# Patient Record
Sex: Female | Born: 1960 | Race: White | Hispanic: No | Marital: Single | Smoking: Former smoker
Health system: Southern US, Community
[De-identification: ages and names within clinical notes are randomized; demographics above are authoritative.]

## PROBLEM LIST (undated history)

## (undated) DIAGNOSIS — G4730 Sleep apnea, unspecified: Secondary | ICD-10-CM

## (undated) DIAGNOSIS — Z800 Family history of malignant neoplasm of digestive organs: Secondary | ICD-10-CM

## (undated) DIAGNOSIS — Z8041 Family history of malignant neoplasm of ovary: Secondary | ICD-10-CM

## (undated) DIAGNOSIS — M26629 Arthralgia of temporomandibular joint, unspecified side: Secondary | ICD-10-CM

## (undated) DIAGNOSIS — S80211A Abrasion, right knee, initial encounter: Secondary | ICD-10-CM

## (undated) DIAGNOSIS — F419 Anxiety disorder, unspecified: Secondary | ICD-10-CM

## (undated) DIAGNOSIS — R079 Chest pain, unspecified: Secondary | ICD-10-CM

## (undated) DIAGNOSIS — Z8051 Family history of malignant neoplasm of kidney: Secondary | ICD-10-CM

## (undated) DIAGNOSIS — C50912 Malignant neoplasm of unspecified site of left female breast: Secondary | ICD-10-CM

## (undated) DIAGNOSIS — R51 Headache: Secondary | ICD-10-CM

## (undated) DIAGNOSIS — M1990 Unspecified osteoarthritis, unspecified site: Secondary | ICD-10-CM

## (undated) DIAGNOSIS — Z803 Family history of malignant neoplasm of breast: Secondary | ICD-10-CM

## (undated) DIAGNOSIS — E039 Hypothyroidism, unspecified: Secondary | ICD-10-CM

## (undated) DIAGNOSIS — I10 Essential (primary) hypertension: Secondary | ICD-10-CM

## (undated) DIAGNOSIS — T4145XA Adverse effect of unspecified anesthetic, initial encounter: Secondary | ICD-10-CM

## (undated) DIAGNOSIS — K219 Gastro-esophageal reflux disease without esophagitis: Secondary | ICD-10-CM

## (undated) HISTORY — DX: Chest pain, unspecified: R07.9

## (undated) HISTORY — PX: UVULOPALATOPHARYNGOPLASTY: SHX827

## (undated) HISTORY — PX: FOOT SURGERY: SHX648

## (undated) HISTORY — DX: Family history of malignant neoplasm of ovary: Z80.41

## (undated) HISTORY — DX: Family history of malignant neoplasm of digestive organs: Z80.0

## (undated) HISTORY — DX: Family history of malignant neoplasm of breast: Z80.3

## (undated) HISTORY — PX: FOREARM SURGERY: SHX651

## (undated) HISTORY — DX: Essential (primary) hypertension: I10

## (undated) HISTORY — DX: Malignant neoplasm of unspecified site of left female breast: C50.912

## (undated) HISTORY — DX: Family history of malignant neoplasm of kidney: Z80.51

---

## 1998-06-10 ENCOUNTER — Ambulatory Visit (HOSPITAL_BASED_OUTPATIENT_CLINIC_OR_DEPARTMENT_OTHER): Admission: RE | Admit: 1998-06-10 | Discharge: 1998-06-10 | Payer: Self-pay | Admitting: Orthopedic Surgery

## 1998-12-31 ENCOUNTER — Ambulatory Visit: Admission: RE | Admit: 1998-12-31 | Discharge: 1998-12-31 | Payer: Self-pay | Admitting: *Deleted

## 1999-03-24 ENCOUNTER — Other Ambulatory Visit: Admission: RE | Admit: 1999-03-24 | Discharge: 1999-03-24 | Payer: Self-pay | Admitting: *Deleted

## 1999-03-24 ENCOUNTER — Encounter (INDEPENDENT_AMBULATORY_CARE_PROVIDER_SITE_OTHER): Payer: Self-pay | Admitting: Specialist

## 1999-07-07 ENCOUNTER — Ambulatory Visit (HOSPITAL_COMMUNITY): Admission: RE | Admit: 1999-07-07 | Discharge: 1999-07-08 | Payer: Self-pay | Admitting: *Deleted

## 1999-11-12 ENCOUNTER — Ambulatory Visit: Admission: RE | Admit: 1999-11-12 | Discharge: 1999-11-12 | Payer: Self-pay | Admitting: *Deleted

## 1999-12-14 ENCOUNTER — Other Ambulatory Visit: Admission: RE | Admit: 1999-12-14 | Discharge: 1999-12-14 | Payer: Self-pay | Admitting: Obstetrics and Gynecology

## 2000-12-20 ENCOUNTER — Other Ambulatory Visit: Admission: RE | Admit: 2000-12-20 | Discharge: 2000-12-20 | Payer: Self-pay | Admitting: Obstetrics and Gynecology

## 2001-11-14 ENCOUNTER — Emergency Department (HOSPITAL_COMMUNITY): Admission: EM | Admit: 2001-11-14 | Discharge: 2001-11-15 | Payer: Self-pay

## 2001-12-11 ENCOUNTER — Other Ambulatory Visit: Admission: RE | Admit: 2001-12-11 | Discharge: 2001-12-11 | Payer: Self-pay | Admitting: Obstetrics and Gynecology

## 2002-11-12 ENCOUNTER — Ambulatory Visit (HOSPITAL_COMMUNITY): Admission: RE | Admit: 2002-11-12 | Discharge: 2002-11-12 | Payer: Self-pay | Admitting: Gastroenterology

## 2003-01-20 ENCOUNTER — Other Ambulatory Visit: Admission: RE | Admit: 2003-01-20 | Discharge: 2003-01-20 | Payer: Self-pay | Admitting: Obstetrics and Gynecology

## 2003-12-10 ENCOUNTER — Ambulatory Visit (HOSPITAL_BASED_OUTPATIENT_CLINIC_OR_DEPARTMENT_OTHER): Admission: RE | Admit: 2003-12-10 | Discharge: 2003-12-10 | Payer: Self-pay | Admitting: Orthopedic Surgery

## 2003-12-10 HISTORY — PX: ELBOW ARTHROSCOPY: SUR87

## 2004-01-13 ENCOUNTER — Encounter: Admission: RE | Admit: 2004-01-13 | Discharge: 2004-01-13 | Payer: Self-pay | Admitting: Family Medicine

## 2004-01-22 ENCOUNTER — Ambulatory Visit (HOSPITAL_COMMUNITY): Admission: RE | Admit: 2004-01-22 | Discharge: 2004-01-22 | Payer: Self-pay | Admitting: Family Medicine

## 2004-02-02 ENCOUNTER — Ambulatory Visit (HOSPITAL_COMMUNITY): Admission: RE | Admit: 2004-02-02 | Discharge: 2004-02-02 | Payer: Self-pay | Admitting: Family Medicine

## 2004-02-04 ENCOUNTER — Ambulatory Visit (HOSPITAL_COMMUNITY): Admission: RE | Admit: 2004-02-04 | Discharge: 2004-02-04 | Payer: Self-pay | Admitting: Family Medicine

## 2004-02-04 ENCOUNTER — Encounter (INDEPENDENT_AMBULATORY_CARE_PROVIDER_SITE_OTHER): Payer: Self-pay | Admitting: *Deleted

## 2004-04-14 ENCOUNTER — Observation Stay (HOSPITAL_COMMUNITY): Admission: RE | Admit: 2004-04-14 | Discharge: 2004-04-15 | Payer: Self-pay | Admitting: Surgery

## 2004-04-14 ENCOUNTER — Encounter (INDEPENDENT_AMBULATORY_CARE_PROVIDER_SITE_OTHER): Payer: Self-pay | Admitting: Specialist

## 2004-04-14 HISTORY — PX: THYROID LOBECTOMY: SHX420

## 2004-07-16 ENCOUNTER — Ambulatory Visit (HOSPITAL_COMMUNITY): Admission: RE | Admit: 2004-07-16 | Discharge: 2004-07-16 | Payer: Self-pay | Admitting: Obstetrics and Gynecology

## 2004-07-16 ENCOUNTER — Encounter (INDEPENDENT_AMBULATORY_CARE_PROVIDER_SITE_OTHER): Payer: Self-pay | Admitting: *Deleted

## 2004-07-16 ENCOUNTER — Ambulatory Visit (HOSPITAL_BASED_OUTPATIENT_CLINIC_OR_DEPARTMENT_OTHER): Admission: RE | Admit: 2004-07-16 | Discharge: 2004-07-16 | Payer: Self-pay | Admitting: Obstetrics and Gynecology

## 2004-07-16 HISTORY — PX: DILATION AND CURETTAGE OF UTERUS: SHX78

## 2004-10-06 ENCOUNTER — Encounter: Admission: RE | Admit: 2004-10-06 | Discharge: 2004-10-06 | Payer: Self-pay | Admitting: Family Medicine

## 2005-04-12 ENCOUNTER — Encounter: Admission: RE | Admit: 2005-04-12 | Discharge: 2005-04-12 | Payer: Self-pay | Admitting: Family Medicine

## 2008-04-12 ENCOUNTER — Emergency Department (HOSPITAL_COMMUNITY): Admission: EM | Admit: 2008-04-12 | Discharge: 2008-04-12 | Payer: Self-pay | Admitting: Emergency Medicine

## 2010-10-23 ENCOUNTER — Encounter: Payer: Self-pay | Admitting: Family Medicine

## 2010-10-24 ENCOUNTER — Encounter: Payer: Self-pay | Admitting: Family Medicine

## 2010-11-25 ENCOUNTER — Other Ambulatory Visit: Payer: Self-pay | Admitting: Radiology

## 2010-11-26 ENCOUNTER — Other Ambulatory Visit: Payer: Self-pay | Admitting: Oncology

## 2010-11-26 ENCOUNTER — Other Ambulatory Visit: Payer: Self-pay | Admitting: Radiology

## 2010-11-26 ENCOUNTER — Encounter (HOSPITAL_BASED_OUTPATIENT_CLINIC_OR_DEPARTMENT_OTHER): Payer: 59 | Admitting: Oncology

## 2010-11-26 DIAGNOSIS — C50912 Malignant neoplasm of unspecified site of left female breast: Secondary | ICD-10-CM

## 2010-11-26 DIAGNOSIS — C50919 Malignant neoplasm of unspecified site of unspecified female breast: Secondary | ICD-10-CM

## 2010-11-26 LAB — COMPREHENSIVE METABOLIC PANEL
ALT: 25 U/L (ref 0–35)
BUN: 17 mg/dL (ref 6–23)
CO2: 24 mEq/L (ref 19–32)
Chloride: 104 mEq/L (ref 96–112)
Creatinine, Ser: 0.63 mg/dL (ref 0.40–1.20)
Glucose, Bld: 87 mg/dL (ref 70–99)
Total Bilirubin: 0.7 mg/dL (ref 0.3–1.2)

## 2010-11-26 LAB — CBC WITH DIFFERENTIAL/PLATELET
BASO%: 0 % (ref 0.0–2.0)
EOS%: 4.2 % (ref 0.0–7.0)
HGB: 14.9 g/dL (ref 11.6–15.9)
LYMPH%: 40.5 % (ref 14.0–49.7)
MCH: 29.8 pg (ref 25.1–34.0)
MCHC: 34.6 g/dL (ref 31.5–36.0)
MCV: 86.1 fL (ref 79.5–101.0)
NEUT#: 2.5 10*3/uL (ref 1.5–6.5)
NEUT%: 44.7 % (ref 38.4–76.8)
Platelets: 223 10*3/uL (ref 145–400)
RBC: 4.99 10*6/uL (ref 3.70–5.45)
RDW: 12.4 % (ref 11.2–14.5)

## 2010-11-28 ENCOUNTER — Ambulatory Visit
Admission: RE | Admit: 2010-11-28 | Discharge: 2010-11-28 | Disposition: A | Discharge status: Home / Self Care | Payer: 59 | Source: Ambulatory Visit | Attending: Radiology | Admitting: Radiology

## 2010-11-28 DIAGNOSIS — C50912 Malignant neoplasm of unspecified site of left female breast: Secondary | ICD-10-CM

## 2010-11-28 MED ORDER — GADOBENATE DIMEGLUMINE 529 MG/ML IV SOLN
20.0000 mL | Freq: Once | INTRAVENOUS | Status: AC | PRN
  Administered 2010-11-28: 20 mL via INTRAVENOUS

## 2010-12-01 ENCOUNTER — Encounter: Payer: 59 | Admitting: Oncology

## 2010-12-03 ENCOUNTER — Encounter: Payer: 59 | Admitting: Genetic Counselor

## 2010-12-03 ENCOUNTER — Other Ambulatory Visit (HOSPITAL_COMMUNITY): Payer: Self-pay | Admitting: Surgery

## 2010-12-03 DIAGNOSIS — C50919 Malignant neoplasm of unspecified site of unspecified female breast: Secondary | ICD-10-CM

## 2010-12-28 ENCOUNTER — Ambulatory Visit (HOSPITAL_COMMUNITY)
Admission: RE | Admit: 2010-12-28 | Discharge: 2010-12-28 | Disposition: A | Discharge status: Home / Self Care | Payer: 59 | Source: Ambulatory Visit | Attending: Surgery | Admitting: Surgery

## 2010-12-28 ENCOUNTER — Ambulatory Visit (HOSPITAL_COMMUNITY): Payer: 59

## 2010-12-28 ENCOUNTER — Other Ambulatory Visit: Payer: Self-pay | Admitting: Surgery

## 2010-12-28 ENCOUNTER — Observation Stay (HOSPITAL_COMMUNITY)
Admission: RE | Admit: 2010-12-28 | Discharge: 2010-12-29 | DRG: 581 | Disposition: A | Discharge status: Home / Self Care | Payer: 59 | Source: Ambulatory Visit | Attending: Surgery | Admitting: Surgery

## 2010-12-28 DIAGNOSIS — C50219 Malignant neoplasm of upper-inner quadrant of unspecified female breast: Principal | ICD-10-CM | POA: Insufficient documentation

## 2010-12-28 DIAGNOSIS — T8859XA Other complications of anesthesia, initial encounter: Secondary | ICD-10-CM

## 2010-12-28 DIAGNOSIS — C50919 Malignant neoplasm of unspecified site of unspecified female breast: Secondary | ICD-10-CM

## 2010-12-28 DIAGNOSIS — I10 Essential (primary) hypertension: Secondary | ICD-10-CM | POA: Insufficient documentation

## 2010-12-28 DIAGNOSIS — E7800 Pure hypercholesterolemia, unspecified: Secondary | ICD-10-CM | POA: Insufficient documentation

## 2010-12-28 HISTORY — PX: MASTECTOMY: SHX3

## 2010-12-28 HISTORY — DX: Other complications of anesthesia, initial encounter: T88.59XA

## 2010-12-28 LAB — COMPREHENSIVE METABOLIC PANEL
AST: 26 U/L (ref 0–37)
BUN: 8 mg/dL (ref 6–23)
CO2: 25 mEq/L (ref 19–32)
Chloride: 105 mEq/L (ref 96–112)
GFR calc Af Amer: >60 mL/min (ref >60)
GFR calc non Af Amer: >60 mL/min (ref >60)
Glucose, Bld: 102 mg/dL — ABNORMAL HIGH (ref 70–99)
Sodium: 137 mEq/L (ref 135–145)
Total Bilirubin: 0.5 mg/dL (ref 0.3–1.2)

## 2010-12-28 LAB — CBC
Hemoglobin: 14.7 g/dL (ref 12.0–15.0)
MCHC: 34.8 g/dL (ref 30.0–36.0)

## 2010-12-28 LAB — DIFFERENTIAL
Basophils Relative: 0 % (ref 0–1)
Eosinophils Absolute: 0.2 10*3/uL (ref 0.0–0.7)
Monocytes Absolute: 0.6 10*3/uL (ref 0.1–1.0)
Neutrophils Relative %: 57 % (ref 43–77)

## 2010-12-28 MED ORDER — TECHNETIUM TC 99M SULFUR COLLOID FILTERED
1.0000 | Freq: Once | INTRAVENOUS | Status: AC | PRN
  Administered 2010-12-28: 1 via INTRADERMAL

## 2011-01-17 ENCOUNTER — Encounter (HOSPITAL_BASED_OUTPATIENT_CLINIC_OR_DEPARTMENT_OTHER): Payer: 59 | Admitting: Oncology

## 2011-01-17 DIAGNOSIS — C50919 Malignant neoplasm of unspecified site of unspecified female breast: Secondary | ICD-10-CM

## 2011-01-17 NOTE — Discharge Summary (Signed)
Deanna Barry, SIPLE               ACCOUNT NO.:  [id number]  MEDICAL RECORD NO.:  [id number]           PATIENT TYPE:  I  LOCATION:  5152                         FACILITY:  [facility]  PHYSICIAN:  Sandria Bales. Ezzard Standing, M.D.  DATE OF BIRTH:  July 01, 1961  DATE OF ADMISSION:  12/28/2010 DATE OF DISCHARGE:  12/29/2010                              DISCHARGE SUMMARY  DISCHARGE DIAGNOSES: 1. 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 2. Migraine headaches. 3. Hypertension. 4. History of thyroid surgery for benign disease. 5. Arthritis. 6. Hypercholesterolemia.  OPERATION PERFORMED:  Bilateral simple mastectomies and left axillary sentinel lymph node biopsy on December 28, 2010.  HISTORY OF ILLNESS:  Deanna Barry is a 50 year old white female who sees Dr. Carolin Coy as her primary medical doctor and Arlana Lindau, nurse practitioner, with Dr. Floyde Parkins from a GYN standpoint.  She had mammograms which shows a new abnormality in upper inner aspect of her left breast.  She underwent a biopsy of that area that showed an invasive ductal carcinoma.  The path number was [id number], and was read by Dr. Hollice Espy.  This tumor was ER and PR receptor positive, HER-2/neu negative and Ki67 was 20%.  Significantly, she had a mother who had breast cancer diagnosed in 2006. She has a grandmother on her father's side who had breast cancer.  [facility] and I saw in conjunction with Dr. Ruthann Cancer and Dr. Antony Blackbird.  We had a lengthy discussion with her that she would be a candidate for breast conservation surgery which would include lumpectomy and sentinel lymph node biopsy but the patient was concerned because of her family history, and she would prefer a mastectomy on the side with breast cancer and prophylactic mastectomy on the other side.  I tried to answer questions regarding the indications and  potential complications of surgery.  We also discussed breast reconstruction which she was not interested in at this time.  She had a friend, Wilfred Lacy, I think assisted her in some of these decision making.  PAST MEDICAL HISTORY: 1. She does have history of migraine headaches, controlled on her     current medications. 2. She has hypertension. 3. Has a history of thyroid surgery, benign disease. 4. Has a history of arthritis. 5. Has hypercholesterolemia.  So on the day of admission, she was taken to the operating room where she underwent bilateral simple mastectomies.  She underwent a biopsy of a left axillary sentinel lymph node which on touch prep was negative.  Postoperatively, she did well.  She has some soreness at her incisions, but drainage on both sides has been fairly minimal.  She has two drains on the left side and one drain on the right side.  Her pathology is pending at the time of this dictation, but she is ready for discharge.  Her discharge instructions will include no driving for 2-3 days.  She can eat, has no restrictions on her diet.  She can remove her bandages and shower starting tomorrow, December 30, 2010.  She  is to record her Jackson-Pratt output a couple of times a day.  I am out of the office next week, so she has been arranged to see one of my partners next week in the office.  I am giving her Vicodin for pain.  She can resume her home medications which include: 1. Mobic 15 mg daily. 2. Omeprazole, over the counter. 3. Simvastatin 20 mg daily. 4. Levothyroxine 25 mcg daily. 5. Atenolol 25 mg daily. 6. She has some medicines for migraine, Imitrex and Effexor she can     use. 7. She is given Vicodin for pain.  Her discharge condition is good.   Her final pathology is pending at the time of this dictation.   Sandria Bales. Ezzard Standing, M.D., FACS   DHN/MEDQ  D:  12/29/2010  T:  12/29/2010  Job:  [id number]  cc:   Valentino Hue. Magrinat, M.D. Carola J.  Gerri Spore, M.D. Arlana Lindau, NP Billie Lade, Ph.D., M.D.  Electronically Signed by Ovidio Kin M.D. on 01/16/2011 07:42:18 PM

## 2011-01-18 NOTE — Op Note (Signed)
Deanna Barry, Deanna Barry               ACCOUNT NO.:  [id number]  MEDICAL RECORD NO.:  [id number]           PATIENT TYPE:  O  LOCATION:  SDSC                         FACILITY:  [facility]  PHYSICIAN:  Sandria Bales. Ezzard Standing, M.D.  DATE OF BIRTH:  September 04, 1961  DATE OF PROCEDURE: 28 December 2010                              OPERATIVE REPORT   PREOPERATIVE DIAGNOSIS:  Left breast cancer approximately 1.7 cm in upper inner quadrant.  POSTOPERATIVE DIAGNOSIS:  Left breast cancer approximately 1.7 cm upper inner quadrant with negative sentinel lymph node that is (T1N0).  PROCEDURE:  Bilateral simple mastectomies, injection of 2 mL of 40% methylene blue and the left breast prepped, left axillary sentinel lymph node biopsy.  SURGEON:  Sandria Bales. Ezzard Standing, MD  FIRST ASSISTANT:  None.  ANESTHESIA:  General endotracheal.  ESTIMATED BLOOD LOSS:  150 mL.  DRAINS:  Three drains, 2 on the left 19-French Blakes and 1 on the right.  INDICATION FOR PROCEDURE:  Deanna Barry is a 50 year old white female who sees Carolin Coy as her primary medical doctor, had an abnormal mammogram with followup which showed changes in the upper inner aspect of her left breast.  A biopsy revealed an invasive ductal carcinoma which was 8 mm on ultrasound but 1.7 cm on MRI.  The tumor is ER/PR receptor positive,  HER-2 negative, Ki67 was 20%.  She has a mother who had breast cancer.  She has a grandmother on her father's side who has breast cancer and though she was a good candidate for breast conservation with a possible lumpectomy, she was interested in bilateral mastectomies.  She presented to the [facility] at [facility] where she met Dr. Ruthann Cancer, Antony Blackbird, and myself and had a full discussion of the options of treatment again of lumpectomy versus mastectomy.  She really had sentinel node mastectomies before.  I saw her.  We talked about possibly of a reconstruction but at this  time she is not interested but realized at a later date this could be done.  I discussed with her the potential complications of surgery.  Potential complications including but not limited to bleeding, infection, nerve injury, recurrence of the tumor.  We also talked about doing a left axillary sentinel lymph node biopsy and if this was positive I will do a flexor dissection and if this was negative, I will stop at that  point.  OPERATIVE NOTE:  The patient brought to the operating room.  She had been injected in the holding area at the left breast with 1 mCi of technetium sulfa colloid.  She was put to sleep in room 17.  I then injected her left breast with 2 mL of 60% of methylene blue.  Her breasts were prepped with ChloraPrep and sterilely draped.    A time-out was held and a surgical checklist run.  I had marked her breast ahead of time both her inframammary folds and her mastectomy incision.  I started on the left side since that is where the sentinel lymph node was.  I went on and made an elliptical incision including her areola and excised  her breast cranial to about two fingerbreadths below the clavicle medially to the edge of the sternum inferiorly to the investing fascia, the rectus abdominis muscle laterally to latissimus dorsi muscle.  I then was able to find in the left axilla through the mastectomy incision a sentinel lymph node.  The sentinel lymph node had counts of 33,00 with background about 50, it was blue consistent with the primary sentinel lymph node.  This was sent for touch prep.  Dr. [place] Rund called back.  He said actually there are 2 lymph nodes identified in touch prep and both of these were negative.  Actually doing the resection, I found a third lymph node which was in the same general area so I took that and labeled that as an addendum or additional left axillary lymph node.  I then elevated the breast off the chest wall off the pectoralis  major and minor muscles.  I palpated the reddened axilla and felt nothing else which felt abnormal.  I also tried to palpate a little bit between the pec major and pec minor again, palpated nothing that was abnormal. There was no obvious gross tumor that I transected and again the tumor is in the upper aspect of the left breast.  I then turned my attention to the right side.  I changed all instruments including knife, Bovie and gloves, gown.  I then made elliptical incision on the right side again including the areola.  I tried to preoperatively mark both sides as symmetrically as I could.  I excised the left breast since again there was no cancer on the right side I did not plan any sentinel lymph node biopsies.  I went cranial to the two fingers below the clavicle laterally to the latissimus dorsi muscle inferiorly to the investing fascia the rectus abdominis muscle medially to the edge of the sternum.  I then elevated the breast off the chest wall and sent this to pathology both breast specimens.  I put a long suture lateral to orient the specimens.  On both sides, there were some redundant skin so I could excise an ellipse on the cranial margin on both sides of superior margin.  I put a long suture caudad on the skin and a short suture lateral.  I excised about 4 cm of excess skin on the left side,, about 2 cm on the right side.  Because of the axillary lymph node biopsy which was little bit more of an axillary dissection, I did put two drains on the left side, these are 19-French Blake drains brought out through stab wounds inferiorly and sewed in place with 2-0 nylon suture on the right side.  I put a single 19-French Blake drain.  I then closed the skin on both sides with subcutaneous 3-0 Vicryl sutures, 5-0 and 4-0 Monocryl suture on both sides.  Both wounds I irrigated with 3-4 L of saline prior to closure.  I then painted both wounds with tincture of Benzoin and  steri-stripped and then sterilely dressed with 4x4s, Hypafix and the pressure dressing.  The patient tolerated the procedure well.  Final pathology is pending at the time of this dictation.   Sandria Bales. Ezzard Standing, M.D., FACS  DHN/MEDQ  D:  12/28/2010  T:  12/29/2010  Job:  [id number]  cc:   Arlana Lindau, NP Carola J. Gerri Spore, M.D. Valentino Hue. Magrinat, M.D. Billie Lade, Ph.D., M.D.  Electronically Signed by Ovidio Kin M.D. on 01/18/2011 10:06:16 AM

## 2011-02-18 NOTE — Op Note (Signed)
NAME:  Deanna Barry, Deanna Barry                         ACCOUNT NO.:  [id number]   MEDICAL RECORD NO.:  [id number]                   PATIENT TYPE:  AMB   LOCATION:  DSC                                  FACILITY:  [facility]   PHYSICIAN:  Artist Pais. Mina Marble, M.D.           DATE OF BIRTH:  04/18/1961   DATE OF PROCEDURE:  12/10/2003  DATE OF DISCHARGE:                                 OPERATIVE REPORT   PREOPERATIVE DIAGNOSIS:  Left elbow pain.   POSTOPERATIVE DIAGNOSIS:  Left elbow pain.   PROCEDURE:  Left elbow arthroscopy with debridement.   SURGEONS:  1. Artist Pais Mina Marble, M.D.  2. Cindee Salt, M.D.   ANESTHESIA:  General.   TOURNIQUET TIME:  An hour.   COMPLICATIONS:  No complications.   DRAINS:  No drains.   OPERATIVE REPORT:  The patient was taken to the operating room where, after  the induction of adequate general anesthesia, the patient was placed in the  lateral decubitus position with the left upper extremity up and placed in an  elbow arthroscopy padded post.  She was then prepped and draped in the usual  sterile fashion.  An Esmarch was used to exsanguinate the limb.  Tourniquet  was inflated to 250 mmHg.  At this point in time, a standard anteromedial  portal was established 2 cm proximal to the medial epicondyle, anterior to  the medial intermuscular septum, and the scope was introduced into the  joint.  Visualization revealed significant capsule inflammation and  synovitis around the radiocapitellar joint, both anteriorly and posteriorly.  Using an inside-out technique, a lateral portal was established under direct  vision; this was all done after the joint was instilled with 30 mL of normal  saline to the soft spot laterally.  A 2.9 Arthrocare wand was then used to  debride down to the radiocapitellar area.  Once this was done, the medial  and lateral gutters were inspected and no loose bodies evident.  The joint  was inspected again and no osseous lesions were noted.   There was a small  amount of chondromalacia on the posterior aspect of the radial head.  No  loose bodies were seen.  A third portal was then established in the soft  spot and the scope was introduced into the posterior fossa.  The olecranon  fossa was debrided using the Arthrocare wand through 2 separate portals on  the lateral aspect and both the medial and lateral gutters were inspected  and no loose bodies seen.  The instruments were removed from the portals and  they were closed with 4-0 nylon.  A sterile dressing of Xeroform, 4 x 4's,  fluffs and a compression wrap were applied.  The patient tolerated the  procedure well and went to the recovery room in stable fashion.  Artist Pais Mina Marble, M.D.   MAW/MEDQ  D:  12/10/2003  T:  12/11/2003  Job:  [id number]

## 2011-02-18 NOTE — Op Note (Signed)
NAME:  Deanna Barry, Deanna Barry                         ACCOUNT NO.:  [id number]   MEDICAL Deanna Barry NO.:  [id number]                   PATIENT TYPE:  AMB   LOCATION:  ENDO                                 FACILITY:  [facility]   PHYSICIAN:  Anselmo Rod, M.D.               DATE OF BIRTH:  [birth year]   DATE OF PROCEDURE:  DATE OF DISCHARGE:                                 OPERATIVE REPORT   PROCEDURE PERFORMED:  Colonoscopy.   ENDOSCOPIST:  Charna Elizabeth, M.D.   INSTRUMENT USED:  Olympus video colonoscope.   INDICATIONS FOR PROCEDURE:  The patient is a 50 year old white female with a  history of rectal bleeding.  Rule out colonic polyps, masses, etc.   PREPROCEDURE PREPARATION:  Informed consent was procured from the patient.  The patient was fasted for eight hours prior to the procedure and prepped  with a bottle of  MiraLax and Gatorade the night prior to the procedure.   PREPROCEDURE PHYSICAL:  The patient had stable vital signs.  Neck supple.  Chest clear to auscultation.  S1 and S2 regular.  Abdomen soft with normal  bowel sounds.   DESCRIPTION OF PROCEDURE:  The patient was placed in left lateral decubitus  position and sedated with 60 mg of Demerol and 6 mg of Versed intravenously.  Once the patient was adequately sedated and maintained on low flow oxygen  and continuous cardiac monitoring, the Olympus video colonoscope was  advanced from the rectum to the cecum without difficulty.  There was some  residual stool in the colon, multiple washes were done.  Very small lesions  could have been missed.  Small internal hemorrhoids were seen on  retroflexion.  No masses, polyps, erosions or ulcerations were present.  There was no evidence of diverticulosis.  The terminal ileum appeared  normal.   IMPRESSION:  1. Normal colonoscopy except for small nonbleeding internal hemorrhoids.  2. No masses or polyps seen.  3. Some residual stool in the colon.  Very small lesions could have been  missed.   RECOMMENDATIONS:  1. A high fiber diet with liberal fluid intake has been advocated for the     patient.  2.     Outpatient followup on a p.r.n. basis.  3. Repeat colorectal cancer screening in the next 10 years unless the     patient develops any abnormal symptoms in the interim.                                                    Anselmo Rod, M.D.    JNM/MEDQ  D:  11/13/2002  T:  11/13/2002  Job:  [id number]   cc:   Teena Irani. Arlyce Dice, M.D.  [address]  [place]  [place] [address]  Fax: [phone]

## 2011-02-18 NOTE — Op Note (Signed)
NAMELILLYROSE, REITAN               ACCOUNT NO.:  [id number]   MEDICAL RECORD NO.:  [id number]          PATIENT TYPE:  AMB   LOCATION:  NESC                         FACILITY:  [facility]   PHYSICIAN:  Sherry A. Dickstein, M.D.DATE OF BIRTH:  1961/05/24   DATE OF PROCEDURE:  07/16/2004  DATE OF DISCHARGE:                                 OPERATIVE REPORT   PREOPERATIVE DIAGNOSES:  1.  Menorrhagia.  2.  Submucosal fibroid.   POSTOPERATIVE DIAGNOSES:  1.  Menorrhagia.  2.  Submucosal fibroid.   PROCEDURE:  D&C hysteroscopy with resectoscope.   SURGEON:  Sherry A. Rosalio Macadamia, M.D.   ANESTHESIA:  MAC.   INDICATIONS FOR PROCEDURE:  This is a 50 year old, G0, P0 woman who has had  excessively heavy menstrual periods, changing a super pad every 1-2 hours.  The patient was evaluated with an ultrasound, the ultrasound revealed  several small fibroids with one being submucosal. Because of the presence of  the submucosal fibroid, the patient is brought to the operating room for [facility]  hysteroscopy with resectoscope.   FINDINGS:  Normal size anteflexed uterus, no adnexal mass, anterior  submucosal fibroid present.   DESCRIPTION OF PROCEDURE:  The patient was brought into the operating room  and given adequate IV sedation. She was placed in the dorsal lithotomy  position, her perineum was washed with Hibiclens, pelvic examination was  performed. The patient was draped in a sterile fashion, surgeon's gloves  were changed. A speculum was placed within the vagina, paracervical block  administered with 1% Nesacaine. The anterior lip of the cervix was grasped  with a single tooth tenaculum. The cervix was sounded, cervix was dilated  with Pratt dilators to a #31. The hysteroscope was introduced into the  endometrial cavity. There was some difficulty getting a good flow from the  sorbitol. The hysteroscope was removed and she was dilated to a #33.  The  hysteroscope was reintroduced. It was  determined, she was definitely in the  endometrial cavity. With slow resection, the endometrial cavity could be  well visualized with the submucosal fibroid well visualized. The submucosal  fibroid was removed in sheets and then endometrial resections were taken  circumferentially. Small bleeders were cauterized, adequate hemostasis was  present throughout.  The endometrial cavity was well visualized at the end  of the procedure with pictures taken.  Sorbitol differential was stable  throughout this period of time.  The hysteroscope was removed and the  tenaculum removed. The speculum was replaced, adequate hemostasis was  present throughout. All instruments were removed from the vagina, the  patient was taken out of the dorsal lithotomy position, she was awakened,  she was removed from the operating table to a stretcher in stable condition.   COMPLICATIONS:  None.   ESTIMATED BLOOD LOSS:  Less than 5 mL.   SORBITOL DIFFERENTIAL:  -140 mL.   SPECIMENS:  1.  Submucosal fibroid.  2.  Endometrial resections.      SAD/MEDQ  D:  07/16/2004  T:  07/16/2004  Job:  [id number]

## 2011-02-18 NOTE — Op Note (Signed)
NAME:  Deanna Barry, Deanna Barry                         ACCOUNT NO.:  [id number]   MEDICAL RECORD NO.:  [id number]                   PATIENT TYPE:  AMB   LOCATION:  DAY                                  FACILITY:  [facility]   PHYSICIAN:  Currie Paris, M.D.           DATE OF BIRTH:  1961-09-07   DATE OF PROCEDURE:  04/14/2004  DATE OF DISCHARGE:                                 OPERATIVE REPORT   CCS#:  [id number]   PREOPERATIVE DIAGNOSES:  Left thyroid lobe nodule.   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   SURGEON:  Currie Paris, M.D.   ASSISTANT:  Ollen Gross. Carolynne Edouard, M.D.   ANESTHESIA:  General endotracheal.   HISTORY:  This patient is a 50 year old who developed some hoarseness and  chest symptoms and a chest CT showed a couple of nodules. These are followed  up with a PET scan which showed no evidence of any abnormality in the chest  but questionable abnormality in the left lobe of the thyroid. Further  evaluation of the thyroid with ultrasound showed a nodule and biopsy showed  some follicular changes.  After discussion with the patient, we elected to  proceed to left thyroid lobectomy with frozen section and possible total  thyroidectomy if this proved malignant on frozen section.   DESCRIPTION OF PROCEDURE:  The patient was seen in the holding area and she  had no further questions. She was taken to the operating room and after  satisfactory general endotracheal anesthesia had been obtained, the neck was  prepped and draped. A curvilinear incision was outlined with a 2-0 silk  suture and made just about a centimeter and a half above the clavicular  heads. The cautery was used to divide the platysma and raise subplatysmal  flaps. A self retaining retractor was placed. The pretracheal fascia was  opened in the midline. Using blunt dissection, I was able to separate the  strap muscles  off of the thyroid.  We identified the middle vine, clipped it  and divided it to give [place] more mobility.  Further small vessels coming into  the thyroid were clipped and divided staying right on the thyroid capsule  and trying to push all of the adjacent structures laterally and posteriorly.  With a little bit of rotation, I could identify initially the recurrent  laryngeal nerve and once I saw that we went ahead and divided a couple more  small vessels coming into the inferior pole. I then freed up and dissected  around the superior pole, double ligated it with a 2-0 silk and a clip and  divided it so that it got even better rotation.  I did identify clearly what  appeared to be a superior parathyroid which was kept lateral and small  vessels clipped near the thyroid so that it could be moved laterally and  preserved. I also saw what appeared to be an inferior parathyroid which was  likewise preserved. As we rotated the thyroid, we could further identify the  nerve coming up and entering the larynx area.  We took very meticulous care  to make sure this was not injured as we divided several small vessels coming  into the thyroid using clips or the knife.  The ligament of Allyson Sabal was  finally freed up so that we could mobilize this off of the trachea.  Once  that was done so that I had the thyroid off of the nerve, I divided the  isthmus by dissecting under it staying right on the trachea and then suture  ligating the white load side of the isthmus. The thyroid is then only  attached by some areolar tissue to the trachea which was then divided with  the cautery and the specimen removed.  We irrigated and made sure everything  was dry. We checked again to look at the viability of what we had identified  as parathyroid which looked okay and likewise identified the nerve and made  sure it looked intact and everything appeared fine with it.   Everything was dry so I put a little Surgicel in place.  Palpation of the  right lobe through the straps showed no abnormality.  The fascia was then  closed over the trachea using 3-0 Vicryl to reapproximate the straps into  the midline.  The platysma was closed with 3-0 Vicryl and the skin with  staples.   Pathology reported that this had no elements of papillary cells and looked  like a follicular lesion most consistent with a benign adenomatous lesion  but would need to wait for permanence to make a more clear cut diagnosis.   Since we did not find an obvious malignancy, the procedure was ended at this  time.  Sterile dressings were applied.  The patient tolerated the procedure  well. There were no operative complications.  All counts were correct.                                               Currie Paris, M.D.    CJS/MEDQ  D:  04/14/2004  T:  04/14/2004  Job:  [id number]   cc:   Otilio Connors. Gerri Spore, M.D.  [address]  [place]  [place] [address]  Fax: [phone]

## 2011-03-04 ENCOUNTER — Encounter (INDEPENDENT_AMBULATORY_CARE_PROVIDER_SITE_OTHER): Payer: Self-pay | Admitting: Surgery

## 2011-03-30 ENCOUNTER — Encounter (HOSPITAL_BASED_OUTPATIENT_CLINIC_OR_DEPARTMENT_OTHER): Payer: 59 | Admitting: Oncology

## 2011-03-30 ENCOUNTER — Other Ambulatory Visit: Payer: Self-pay | Admitting: Oncology

## 2011-03-30 DIAGNOSIS — C50919 Malignant neoplasm of unspecified site of unspecified female breast: Secondary | ICD-10-CM

## 2011-03-30 LAB — CBC WITH DIFFERENTIAL/PLATELET
EOS%: 3.1 % (ref 0.0–7.0)
HCT: 41.7 % (ref 34.8–46.6)
HGB: 14.5 g/dL (ref 11.6–15.9)
LYMPH%: 36.5 % (ref 14.0–49.7)
MCH: 29.9 pg (ref 25.1–34.0)
MCHC: 34.7 g/dL (ref 31.5–36.0)
MONO%: 8.3 % (ref 0.0–14.0)
NEUT#: 2.9 10*3/uL (ref 1.5–6.5)

## 2011-06-22 ENCOUNTER — Other Ambulatory Visit: Payer: Self-pay | Admitting: Oncology

## 2011-06-22 ENCOUNTER — Encounter (HOSPITAL_BASED_OUTPATIENT_CLINIC_OR_DEPARTMENT_OTHER): Payer: 59 | Admitting: Oncology

## 2011-06-22 DIAGNOSIS — C50919 Malignant neoplasm of unspecified site of unspecified female breast: Secondary | ICD-10-CM

## 2011-06-22 LAB — CBC WITH DIFFERENTIAL/PLATELET
Eosinophils Absolute: 0.1 10*3/uL (ref 0.0–0.5)
MONO#: 0.5 10*3/uL (ref 0.1–0.9)
NEUT#: 4.4 10*3/uL (ref 1.5–6.5)
RBC: 4.88 10*6/uL (ref 3.70–5.45)
RDW: 12.3 % (ref 11.2–14.5)
WBC: 7.5 10*3/uL (ref 3.9–10.3)
lymph#: 2.5 10*3/uL (ref 0.9–3.3)

## 2011-06-22 LAB — COMPREHENSIVE METABOLIC PANEL
ALT: 34 U/L (ref 0–35)
Albumin: 4.1 g/dL (ref 3.5–5.2)
CO2: 28 mEq/L (ref 19–32)
Glucose, Bld: 129 mg/dL — ABNORMAL HIGH (ref 70–99)
Potassium: 3.7 mEq/L (ref 3.5–5.3)
Sodium: 139 mEq/L (ref 135–145)
Total Protein: 7.8 g/dL (ref 6.0–8.3)

## 2011-06-22 LAB — FOLLICLE STIMULATING HORMONE: FSH: 33.2 m[IU]/mL

## 2011-06-22 LAB — LUTEINIZING HORMONE: LH: 15 m[IU]/mL

## 2011-06-29 ENCOUNTER — Encounter (HOSPITAL_BASED_OUTPATIENT_CLINIC_OR_DEPARTMENT_OTHER): Payer: 59 | Admitting: Oncology

## 2011-06-29 DIAGNOSIS — C50919 Malignant neoplasm of unspecified site of unspecified female breast: Secondary | ICD-10-CM

## 2011-07-14 IMAGING — CR DG CHEST 2V
2 series · 2 of 2 positions shown · non-contrast
Comparison: CT chest of 04/12/2005 and chest x-ray of 04/12/2004

CLINICAL DATA: Preop for bilateral mastectomy, cough, hypertension

CHEST - 2 VIEW

[view not recorded (1 of 2)]
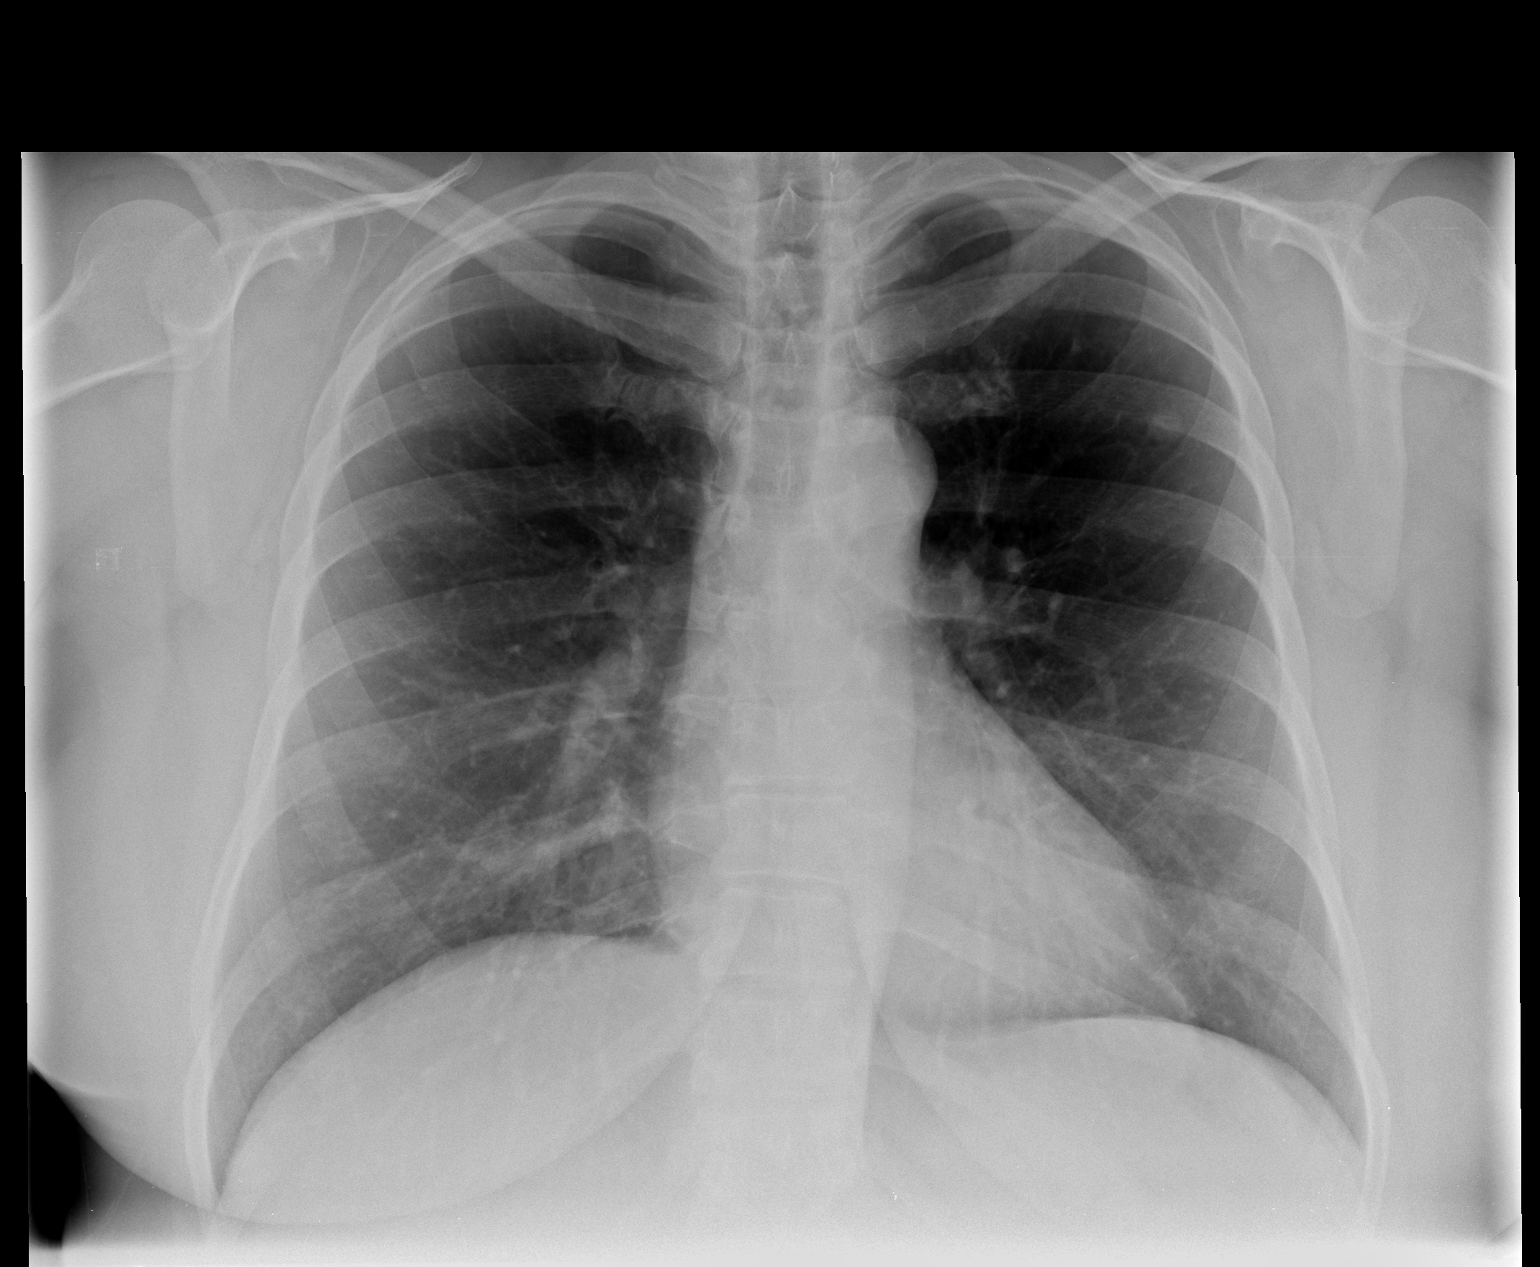

[view not recorded (2 of 2)]
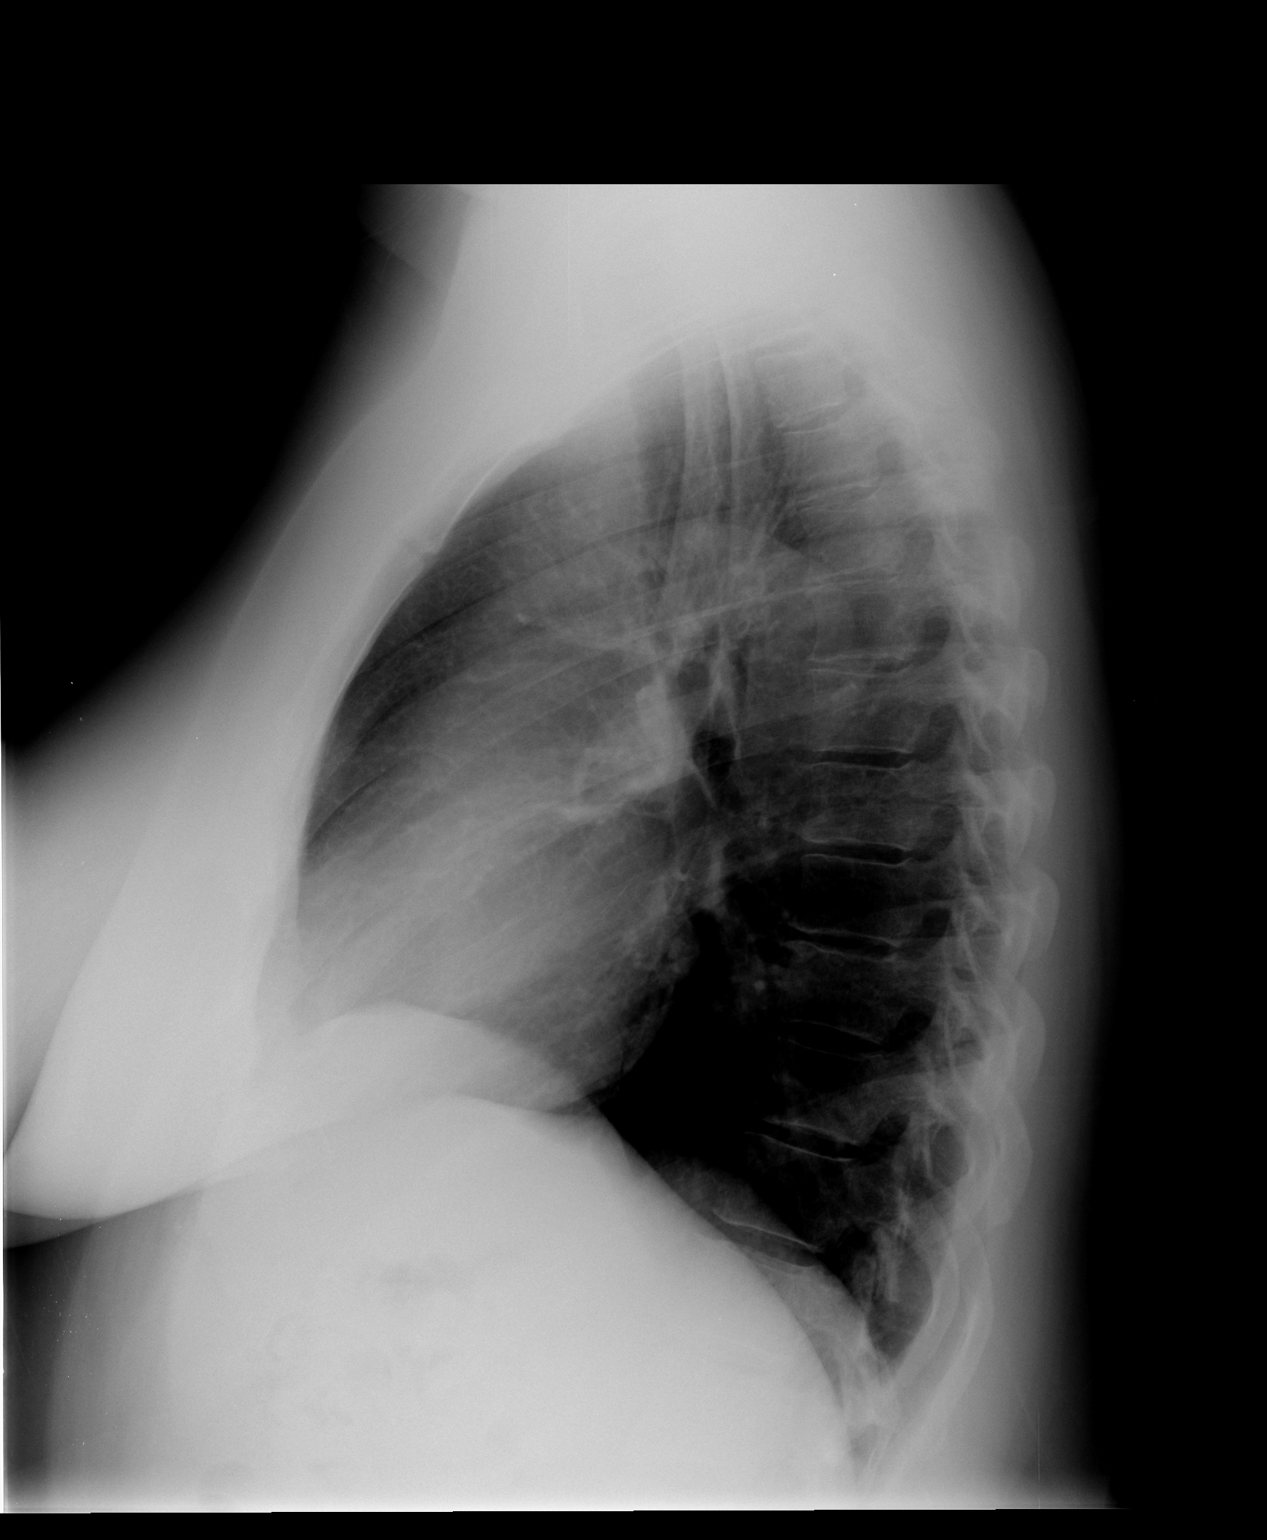

[2 of 2 positions shown; findings below may reference images not displayed]

FINDINGS: No active infiltrate or effusion is seen.  The previously
noted left upper lobe lung nodule is stable and considered benign.
Mediastinal contours appear normal.  The heart is within normal
limits in size.  No bony abnormality is seen.
IMPRESSION: No active lung disease.  Stable left upper lobe lung nodule
consistent with a benign process.

## 2011-10-04 DIAGNOSIS — S80211A Abrasion, right knee, initial encounter: Secondary | ICD-10-CM

## 2011-10-04 HISTORY — DX: Abrasion, right knee, initial encounter: S80.211A

## 2011-11-10 ENCOUNTER — Encounter (HOSPITAL_BASED_OUTPATIENT_CLINIC_OR_DEPARTMENT_OTHER): Payer: Self-pay | Admitting: *Deleted

## 2011-11-15 ENCOUNTER — Encounter (HOSPITAL_BASED_OUTPATIENT_CLINIC_OR_DEPARTMENT_OTHER)
Admission: RE | Admit: 2011-11-15 | Discharge: 2011-11-15 | Disposition: A | Discharge status: Home / Self Care | Payer: 59 | Source: Ambulatory Visit | Attending: Plastic Surgery | Admitting: Plastic Surgery

## 2011-11-15 LAB — BASIC METABOLIC PANEL
BUN: 14 mg/dL (ref 6–23)
CO2: 26 mEq/L (ref 19–32)
Calcium: 9.5 mg/dL (ref 8.4–10.5)
Chloride: 104 mEq/L (ref 96–112)
Creatinine, Ser: 0.67 mg/dL (ref 0.50–1.10)
GFR calc Af Amer: >90 mL/min (ref >90)
GFR calc non Af Amer: >90 mL/min (ref >90)
Glucose, Bld: 118 mg/dL — ABNORMAL HIGH (ref 70–99)
Potassium: 4 mEq/L (ref 3.5–5.1)
Sodium: 143 mEq/L (ref 135–145)

## 2011-11-16 ENCOUNTER — Other Ambulatory Visit: Payer: Self-pay | Admitting: Plastic Surgery

## 2011-11-17 ENCOUNTER — Encounter (HOSPITAL_BASED_OUTPATIENT_CLINIC_OR_DEPARTMENT_OTHER): Payer: Self-pay | Admitting: Plastic Surgery

## 2011-11-17 ENCOUNTER — Encounter (HOSPITAL_BASED_OUTPATIENT_CLINIC_OR_DEPARTMENT_OTHER)
Admission: RE | Disposition: A | Discharge status: Home / Self Care | Payer: Self-pay | Source: Ambulatory Visit | Attending: Plastic Surgery

## 2011-11-17 ENCOUNTER — Ambulatory Visit (HOSPITAL_BASED_OUTPATIENT_CLINIC_OR_DEPARTMENT_OTHER): Payer: 59 | Admitting: Anesthesiology

## 2011-11-17 ENCOUNTER — Encounter (HOSPITAL_BASED_OUTPATIENT_CLINIC_OR_DEPARTMENT_OTHER): Payer: Self-pay | Admitting: Anesthesiology

## 2011-11-17 ENCOUNTER — Other Ambulatory Visit: Payer: Self-pay | Admitting: Plastic Surgery

## 2011-11-17 ENCOUNTER — Ambulatory Visit (HOSPITAL_BASED_OUTPATIENT_CLINIC_OR_DEPARTMENT_OTHER)
Admission: RE | Admit: 2011-11-17 | Discharge: 2011-11-17 | Disposition: A | Discharge status: Home / Self Care | Payer: 59 | Source: Ambulatory Visit | Attending: Plastic Surgery | Admitting: Plastic Surgery

## 2011-11-17 DIAGNOSIS — K219 Gastro-esophageal reflux disease without esophagitis: Secondary | ICD-10-CM | POA: Insufficient documentation

## 2011-11-17 DIAGNOSIS — Z9010 Acquired absence of unspecified breast and nipple: Secondary | ICD-10-CM | POA: Insufficient documentation

## 2011-11-17 DIAGNOSIS — Z853 Personal history of malignant neoplasm of breast: Secondary | ICD-10-CM | POA: Insufficient documentation

## 2011-11-17 DIAGNOSIS — G4730 Sleep apnea, unspecified: Secondary | ICD-10-CM | POA: Insufficient documentation

## 2011-11-17 DIAGNOSIS — I10 Essential (primary) hypertension: Secondary | ICD-10-CM | POA: Insufficient documentation

## 2011-11-17 DIAGNOSIS — Z01812 Encounter for preprocedural laboratory examination: Secondary | ICD-10-CM | POA: Insufficient documentation

## 2011-11-17 DIAGNOSIS — E039 Hypothyroidism, unspecified: Secondary | ICD-10-CM | POA: Insufficient documentation

## 2011-11-17 HISTORY — DX: Sleep apnea, unspecified: G47.30

## 2011-11-17 HISTORY — PX: BREAST RECONSTRUCTION: SHX9

## 2011-11-17 HISTORY — DX: Gastro-esophageal reflux disease without esophagitis: K21.9

## 2011-11-17 HISTORY — DX: Hypothyroidism, unspecified: E03.9

## 2011-11-17 HISTORY — DX: Adverse effect of unspecified anesthetic, initial encounter: T41.45XA

## 2011-11-17 HISTORY — DX: Unspecified osteoarthritis, unspecified site: M19.90

## 2011-11-17 HISTORY — DX: Arthralgia of temporomandibular joint, unspecified side: M26.629

## 2011-11-17 HISTORY — DX: Headache: R51

## 2011-11-17 HISTORY — DX: Abrasion, right knee, initial encounter: S80.211A

## 2011-11-17 LAB — POCT HEMOGLOBIN-HEMACUE: Hemoglobin: 14.1 g/dL (ref 12.0–15.0)

## 2011-11-17 SURGERY — BREAST REDUCTION WITH LIPOSUCTION
Anesthesia: General | Laterality: Bilateral

## 2011-11-17 SURGERY — RECONSTRUCTION, BREAST
Anesthesia: General | Site: Breast | Laterality: Bilateral

## 2011-11-17 MED ORDER — HYDROMORPHONE HCL PF 1 MG/ML IJ SOLN: 0.2500 mg | INTRAMUSCULAR | Status: DC | PRN

## 2011-11-17 MED ORDER — CIPROFLOXACIN HCL 500 MG PO TABS: 500.0000 mg | ORAL_TABLET | Freq: Two times a day (BID) | ORAL | Status: AC

## 2011-11-17 MED ORDER — LACTATED RINGERS IR SOLN
Status: DC | PRN
  Administered 2011-11-17 (×2): 1000 mL

## 2011-11-17 MED ORDER — FENTANYL CITRATE 0.05 MG/ML IJ SOLN
INTRAMUSCULAR | Status: DC | PRN
  Administered 2011-11-17 (×2): 50 ug via INTRAVENOUS
  Administered 2011-11-17: 25 ug via INTRAVENOUS

## 2011-11-17 MED ORDER — MEPERIDINE HCL 25 MG/ML IJ SOLN: 6.2500 mg | INTRAMUSCULAR | Status: DC | PRN

## 2011-11-17 MED ORDER — MIDAZOLAM HCL 5 MG/5ML IJ SOLN
INTRAMUSCULAR | Status: DC | PRN
  Administered 2011-11-17: 2 mg via INTRAVENOUS

## 2011-11-17 MED ORDER — LIDOCAINE HCL 1 % IJ SOLN
INTRAMUSCULAR | Status: DC | PRN
  Administered 2011-11-17: 50 mL

## 2011-11-17 MED ORDER — ONDANSETRON HCL 4 MG/2ML IJ SOLN
INTRAMUSCULAR | Status: DC | PRN
  Administered 2011-11-17: 4 mg via INTRAVENOUS

## 2011-11-17 MED ORDER — CIPROFLOXACIN IN D5W 400 MG/200ML IV SOLN
400.0000 mg | INTRAVENOUS | Status: AC
  Administered 2011-11-17 (×2): 400 mg via INTRAVENOUS

## 2011-11-17 MED ORDER — EPINEPHRINE HCL 1 MG/ML IJ SOLN
INTRAMUSCULAR | Status: DC | PRN
  Administered 2011-11-17: 1 mg via SUBCUTANEOUS

## 2011-11-17 MED ORDER — PROMETHAZINE HCL 25 MG/ML IJ SOLN: 6.2500 mg | INTRAMUSCULAR | Status: DC | PRN

## 2011-11-17 MED ORDER — HYDROCODONE-ACETAMINOPHEN 5-500 MG PO TABS: ORAL_TABLET | ORAL | Status: DC

## 2011-11-17 MED ORDER — LACTATED RINGERS IV SOLN
INTRAVENOUS | Status: DC
  Administered 2011-11-17: 08:00:00 via INTRAVENOUS

## 2011-11-17 MED ORDER — PROPOFOL 10 MG/ML IV EMUL
INTRAVENOUS | Status: DC | PRN
  Administered 2011-11-17: 200 mg via INTRAVENOUS

## 2011-11-17 SURGICAL SUPPLY — 73 items
ADH SKN CLS APL DERMABOND .7 (GAUZE/BANDAGES/DRESSINGS) ×1
BAG DECANTER FOR FLEXI CONT (MISCELLANEOUS) ×2 IMPLANT
BANDAGE GAUZE ELAST BULKY 4 IN (GAUZE/BANDAGES/DRESSINGS) ×4 IMPLANT
BINDER BREAST LRG (GAUZE/BANDAGES/DRESSINGS) IMPLANT
BINDER BREAST MEDIUM (GAUZE/BANDAGES/DRESSINGS) IMPLANT
BINDER BREAST XLRG (GAUZE/BANDAGES/DRESSINGS) ×1 IMPLANT
BINDER BREAST XXLRG (GAUZE/BANDAGES/DRESSINGS) IMPLANT
BLADE HEX COATED 2.75 (ELECTRODE) ×2 IMPLANT
BLADE SURG 15 STRL LF DISP TIS (BLADE) ×1 IMPLANT
BLADE SURG 15 STRL SS (BLADE) ×4
CANISTER SUCTION 1200CC (MISCELLANEOUS) ×2 IMPLANT
CHLORAPREP W/TINT 26ML (MISCELLANEOUS) ×2 IMPLANT
CLOTH BEACON ORANGE TIMEOUT ST (SAFETY) ×2 IMPLANT
COVER MAYO STAND STRL (DRAPES) ×2 IMPLANT
COVER TABLE BACK 60X90 (DRAPES) ×2 IMPLANT
DECANTER SPIKE VIAL GLASS SM (MISCELLANEOUS) IMPLANT
DERMABOND ADVANCED (GAUZE/BANDAGES/DRESSINGS) ×1
DERMABOND ADVANCED .7 DNX12 (GAUZE/BANDAGES/DRESSINGS) ×1 IMPLANT
DRAIN CHANNEL 19F RND (DRAIN) IMPLANT
DRAIN CHANNEL 7F FF FLAT (WOUND CARE) ×2 IMPLANT
DRAPE LAPAROSCOPIC ABDOMINAL (DRAPES) ×2 IMPLANT
DRSG PAD ABDOMINAL 8X10 ST (GAUZE/BANDAGES/DRESSINGS) ×6 IMPLANT
ELECT BLADE 4.0 EZ CLEAN MEGAD (MISCELLANEOUS) ×2
ELECT BLADE 6.5 .24CM SHAFT (ELECTRODE) IMPLANT
ELECT REM PT RETURN 9FT ADLT (ELECTROSURGICAL) ×2
ELECTRODE BLDE 4.0 EZ CLN MEGD (MISCELLANEOUS) ×1 IMPLANT
ELECTRODE REM PT RTRN 9FT ADLT (ELECTROSURGICAL) ×1 IMPLANT
EVACUATOR SILICONE 100CC (DRAIN) ×2 IMPLANT
GAUZE SPONGE 4X4 12PLY STRL LF (GAUZE/BANDAGES/DRESSINGS) IMPLANT
GLOVE BIO SURGEON STRL SZ 6.5 (GLOVE) ×2 IMPLANT
GLOVE BIOGEL M 7.0 STRL (GLOVE) ×1 IMPLANT
GLOVE BIOGEL PI IND STRL 7.5 (GLOVE) IMPLANT
GLOVE BIOGEL PI INDICATOR 7.5 (GLOVE) ×1
GLOVE SKINSENSE NS SZ7.0 (GLOVE) ×2
GLOVE SKINSENSE STRL SZ7.0 (GLOVE) IMPLANT
GOWN PREVENTION PLUS XLARGE (GOWN DISPOSABLE) ×5 IMPLANT
IV NS 1000ML (IV SOLUTION) ×2
IV NS 1000ML BAXH (IV SOLUTION) IMPLANT
IV NS 500ML (IV SOLUTION) ×2
IV NS 500ML BAXH (IV SOLUTION) ×1 IMPLANT
KIT FILL SYSTEM UNIVERSAL (SET/KITS/TRAYS/PACK) IMPLANT
NDL 21 GA WING INFUSION (NEEDLE) IMPLANT
NDL HYPO 25X1 1.5 SAFETY (NEEDLE) IMPLANT
NDL SAFETY ECLIPSE 18X1.5 (NEEDLE) IMPLANT
NDL SPNL 18GX3.5 QUINCKE PK (NEEDLE) IMPLANT
NDL SPNL 22GX3.5 QUINCKE BK (NEEDLE) IMPLANT
NEEDLE 21 GA WING INFUSION (NEEDLE) IMPLANT
NEEDLE HYPO 18GX1.5 SHARP (NEEDLE) ×2
NEEDLE HYPO 25X1 1.5 SAFETY (NEEDLE) ×2 IMPLANT
NEEDLE SPNL 18GX3.5 QUINCKE PK (NEEDLE) ×2 IMPLANT
NEEDLE SPNL 22GX3.5 QUINCKE BK (NEEDLE) IMPLANT
NS IRRIG 1000ML POUR BTL (IV SOLUTION) IMPLANT
PACK BASIN DAY SURGERY FS (CUSTOM PROCEDURE TRAY) ×2 IMPLANT
PENCIL BUTTON HOLSTER BLD 10FT (ELECTRODE) ×2 IMPLANT
PIN SAFETY STERILE (MISCELLANEOUS) ×1 IMPLANT
SLEEVE SCD COMPRESS KNEE MED (MISCELLANEOUS) ×2 IMPLANT
SPONGE LAP 18X18 X RAY DECT (DISPOSABLE) ×3 IMPLANT
STRIP CLOSURE SKIN 1/2X4 (GAUZE/BANDAGES/DRESSINGS) IMPLANT
SUT MON AB 5-0 PS2 18 (SUTURE) ×5 IMPLANT
SUT PDS AB 2-0 CT2 27 (SUTURE) IMPLANT
SUT SILK 3 0 PS 1 (SUTURE) ×2 IMPLANT
SUT VIC AB 3-0 SH 27 (SUTURE) ×4
SUT VIC AB 3-0 SH 27X BRD (SUTURE) IMPLANT
SUT VICRYL 4-0 PS2 18IN ABS (SUTURE) ×6 IMPLANT
SYR 20CC LL (SYRINGE) ×2 IMPLANT
SYR 50ML LL SCALE MARK (SYRINGE) IMPLANT
SYR BULB IRRIGATION 50ML (SYRINGE) ×2 IMPLANT
SYR CONTROL 10ML LL (SYRINGE) ×1 IMPLANT
TOWEL OR 17X24 6PK STRL BLUE (TOWEL DISPOSABLE) ×4 IMPLANT
TUBE CONNECTING 20X1/4 (TUBING) ×2 IMPLANT
UNDERPAD 30X30 INCONTINENT (UNDERPADS AND DIAPERS) ×4 IMPLANT
WATER STERILE IRR 1000ML POUR (IV SOLUTION) ×1 IMPLANT
YANKAUER SUCT BULB TIP NO VENT (SUCTIONS) ×2 IMPLANT

## 2011-11-17 NOTE — Brief Op Note (Signed)
11/17/2011  10:54 AM  PATIENT:  Deanna Barry  51 y.o. female  PRE-OPERATIVE DIAGNOSIS:  ACQUIRED ABSENCE OF BREAST HISTORY OF BREAST CANCER  POST-OPERATIVE DIAGNOSIS:  ACQUIRED ABSENCE OF BREAST HISTORY OF BREAST CANCER  PROCEDURE:  Procedure(s) (LRB): EXCISION OF AXILLARY TISSUE WITH SUCTION ASSISTED LIPECTOMY  SURGEON:  Surgeon(s) and Role:    * Irasema Chalk Sanger, DO - Primary  PHYSICIAN ASSISTANT:   ASSISTANTS: none   ANESTHESIA:   local and general  EBL:  Total I/O In: 1000 [I.V.:1000] Out: -   BLOOD ADMINISTERED:none  DRAINS: (2) Jackson-Pratt drain(s) with closed bulb suction in the axillary area bilaterally   LOCAL MEDICATIONS USED:  OTHER TUMESCENT  SPECIMEN:  Aspirate  DISPOSITION OF SPECIMEN:  PATHOLOGY  COUNTS:  YES  TOURNIQUET:  * No tourniquets in log *  DICTATION: Dictated  PLAN OF CARE: Discharge to home after PACU  PATIENT DISPOSITION:  PACU - hemodynamically stable.   Delay start of Pharmacological VTE agent (>24hrs) due to surgical blood loss or risk of bleeding: no

## 2011-11-17 NOTE — H&P (Signed)
History and Physical  [name]   11/01/2011 3:30 PM Office Visit  MRN: [id number]  Department:  Plastic Surgery  Dept Phone: [phone]  Description: Female DOB: [birth year]  Provider: Wayland Denis, DO     Diagnoses  -   CA - breast cancer   - Primary   174.9     Reason for Visit  -   Breast Reconstruction     Subjective:    Patient ID: Deanna Barry is a 51 y.o. female.  HPI The patient is a 51 yo wf here for her history and physical for excision of bilateral breast tissue after bilateral mastectomies for treatment of left breast cancer. The patient was initially referred for surgical evaluation of an abnormal mammogram first noted 11/2010. Follow-up breast biopsy (11/25/2010), examination in office (11/2010), mammogram (11/2010) and ultrasound (11/2010) showed abnormality. 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 The patient subsequently underwent simple mastectomy bilaterally (date - 12/28/2010) with sentinel node biopsy and axillary lymph node dissection on the left side. She is on tamoxifen. She denies any tobacco use.  The following portions of the patient's history were reviewed and updated as appropriate: allergies, current medications, past family history, past medical history, past social history, past surgical history and problem list.  Review of Systems  Constitutional: Negative.   HENT: Negative.   Eyes: Negative.   Respiratory: Negative.   Cardiovascular: Negative.   Gastrointestinal: Negative.   Genitourinary: Negative.   Musculoskeletal: Negative.   Neurological: Negative.   Hematological: Negative.   Psychiatric/Behavioral: Negative.    Objective:   Physical Exam  Constitutional: She is oriented to person, place, and time. She appears well-developed and well-nourished.  HENT:   Head: Normocephalic and atraumatic.  Right Ear: External ear normal.  Left Ear:  External ear normal.  Eyes: EOM are normal. Pupils are equal, round, and reactive to light.  Neck: Normal range of motion.  Cardiovascular: Normal rate.   Pulmonary/Chest: Effort normal. No respiratory distress. She has no wheezes. She exhibits deformity. She exhibits no mass, no tenderness, no edema and no swelling. Breasts are asymmetrical.       Well healed mastectomy scars with mild hypertrophy of the scar and excess tissue medially and laterally.  Abdominal: Soft.  Musculoskeletal: Normal range of motion.  Neurological: She is alert and oriented to person, place, and time.  Skin: Skin is warm.  Psychiatric: She has a normal mood and affect. Her behavior is normal. Judgment and thought content normal.     Assessment:  1.  CA - breast cancer       Plan:  Bilateral breast reconstruction with excision and repair of excess skin on lateral aspect and axillary region with suction assisted lipectomy. Risks and complications were reviewed and include bleeding, pain, scar, risk of anesthesia. The patient was seen in holding and marked.  Consent was confirmed and signed.  There is no change in her history or physical.       Medications Ordered in the office    Disp Refills Start End    HYDROcodone-acetaminophen (VICODIN) 5-500 mg per tablet 30 tablet 0 11/01/2011      5/325mg     docusate sodium (COLACE) 100 MG capsule 20 capsule 0 11/01/2011      Take 1 capsule (100 mg total) by mouth 3 (three) times daily as needed for Constipation. - Oral    promethazine (PHENERGAN) 12.5 MG tablet 10 tablet 0  11/01/2011 11/08/2011    Take 2 tablets (25 mg total) by mouth every 6 (six) hours as needed for 7 days for Nausea. - Oral    ciprofloxacin (CIPRO) 500 MG tablet 14 tablet 0 11/01/2011      Take 1 tablet (500 mg total) by mouth 2 times daily. - Oral

## 2011-11-17 NOTE — Transfer of Care (Signed)
Immediate Anesthesia Transfer of Care Note  Patient: Deanna Barry  Procedure(s) Performed: Procedure(s) (LRB): BREAST RECONSTRUCTION (Bilateral)  Patient Location: PACU  Anesthesia Type: General  Level of Consciousness: awake, alert  and oriented  Airway & Oxygen Therapy: Patient Spontanous Breathing and Patient connected to face mask oxygen  Post-op Assessment: Report given to PACU RN and Post -op Vital signs reviewed and stable  Post vital signs: Reviewed and stable  Complications: No apparent anesthesia complications

## 2011-11-17 NOTE — Anesthesia Preprocedure Evaluation (Signed)
Anesthesia Evaluation  Patient identified by MRN, date of birth, ID band  History of Anesthesia Complications Negative for: history of anesthetic complications  Airway Mallampati: II  Neck ROM: Full    Dental  (+) Teeth Intact   Pulmonary sleep apnea ,  clear to auscultation        Cardiovascular hypertension, Regular Normal    Neuro/Psych    GI/Hepatic GERD-  ,  Endo/Other  Hypothyroidism   Renal/GU      Musculoskeletal   Abdominal   Peds  Hematology   Anesthesia Other Findings   Reproductive/Obstetrics                           Anesthesia Physical Anesthesia Plan  ASA: II  Anesthesia Plan: General   Post-op Pain Management:    Induction: Intravenous  Airway Management Planned: LMA  Additional Equipment:   Intra-op Plan:   Post-operative Plan: Extubation in OR  Informed Consent:   Dental advisory given  Plan Discussed with: CRNA and Surgeon  Anesthesia Plan Comments:         Anesthesia Quick Evaluation

## 2011-11-17 NOTE — Anesthesia Postprocedure Evaluation (Signed)
  Anesthesia Post-op Note  Patient: Deanna Barry  Procedure(s) Performed: Procedure(s) (LRB): BREAST RECONSTRUCTION (Bilateral)  Patient Location: PACU  Anesthesia Type: General  Level of Consciousness: awake  Airway and Oxygen Therapy: Patient Spontanous Breathing  Post-op Pain: mild  Post-op Assessment: Post-op Vital signs reviewed  Post-op Vital Signs: stable  Complications: No apparent anesthesia complications

## 2011-11-17 NOTE — Anesthesia Procedure Notes (Signed)
Procedure Name: LMA Insertion Date/Time: 11/17/2011 8:54 AM Performed by: Zenia Resides D Pre-anesthesia Checklist: Patient identified, Emergency Drugs available, Suction available and Patient being monitored Patient Re-evaluated:Patient Re-evaluated prior to inductionOxygen Delivery Method: Circle System Utilized Preoxygenation: Pre-oxygenation with 100% oxygen Intubation Type: IV induction Ventilation: Mask ventilation without difficulty LMA: LMA with gastric port inserted LMA Size: 4.0 Number of attempts: 1 Placement Confirmation: positive ETCO2 and breath sounds checked- equal and bilateral Tube secured with: Tape Dental Injury: Teeth and Oropharynx as per pre-operative assessment

## 2011-11-17 NOTE — Discharge Instructions (Addendum)
[facility]  [address] [place], [place] [address] [phone]   Please Measure JP Drainage  Two times a day and record. Sink bathe till back to office to see Dr Kelly Splinter  About my Jackson-Pratt Bulb Drain  What is a Jackson-Pratt bulb? A Jackson-Pratt is a soft, round device used to collect drainage. It is connected to a long, thin drainage catheter, which is held in place by one or two small stiches near your surgical incision site. When the bulb is squeezed, it forms a vacuum, forcing the drainage to empty into the bulb.  Emptying the Jackson-Pratt bulb- To empty the bulb: 1. Release the plug on the top of the bulb. 2. Pour the bulb's contents into a measuring container which your nurse will provide. 3. Record the time emptied and amount of drainage. Empty the drain(s) as often as your     doctor or nurse recommends.  Date                  Time                    Amount (Drain 1)                 Amount (Drain 2)  _____________________________________________________________________  _____________________________________________________________________  _____________________________________________________________________  _____________________________________________________________________  _____________________________________________________________________  _____________________________________________________________________  _____________________________________________________________________  _____________________________________________________________________  Squeezing the Jackson-Pratt Bulb- To squeeze the bulb: 1. Make sure the plug at the top of the bulb is open. 2. Squeeze the bulb tightly in your fist. You will hear air squeezing from the bulb. 3. Replace the plug while the bulb is squeezed. 4. Use a safety pin to attach the bulb to your clothing. This will keep the catheter from     pulling at the bulb insertion site.  When to  call your doctor- Call your doctor if:  Drain site becomes red, swollen or hot.  You have a fever greater than 101 degrees F.  There is oozing at the drain site.  Drain falls out (apply a guaze bandage over the drain hole and secure it with tape).  Drainage increases daily not related to activity patterns. (You will usually have more drainage when you are active than when you are resting.)  Drainage has a bad odor.     Call your surgeon if you experience:   1.  Fever over 101.0. 2.  Inability to urinate. 3.  Nausea and/or vomiting. 4.  Extreme swelling or bruising at the surgical site. 5.  Continued bleeding from the incision. 6.  Increased pain, redness or drainage from the incision. 7.  Problems related to your pain medication.   Post Anesthesia Home Care Instructions  Activity: Get plenty of rest for the remainder of the day. A responsible adult should stay with you for 24 hours following the procedure.  For the next 24 hours, DO NOT: -Drive a car -Advertising copywriter -Drink alcoholic beverages -Take any medication unless instructed by your physician -Make any legal decisions or sign important papers.  Meals: Start with liquid foods such as gelatin or soup. Progress to regular foods as tolerated. Avoid greasy, spicy, heavy foods. If nausea and/or vomiting occur, drink only clear liquids until the nausea and/or vomiting subsides. Call your physician if vomiting continues.  Special Instructions/Symptoms: Your throat may feel dry or sore from the anesthesia or the breathing tube placed in your throat during surgery. If this causes discomfort, gargle with warm salt water. The discomfort should disappear  within 24 hours.

## 2011-11-18 NOTE — Op Note (Signed)
NAME:  SALONS, Raphaela                     ACCOUNT NO.:  MEDICAL RECORD NO.:  [id number]  LOCATION:                                 FACILITY:  PHYSICIAN:  Symphani Eckstrom Sanger, DO      DATE OF BIRTH:  1961-06-20  DATE OF PROCEDURE:  11/17/2011 DATE OF DISCHARGE:                              OPERATIVE REPORT   PREOPERATIVE DIAGNOSIS:  Acquired bilateral absence of breast due to breast cancer.  POSTOPERATIVE DIAGNOSIS:  Acquired bilateral absence of breast due to breast cancer.  PROCEDURE:  Excision of axillary tissue with suction-assisted lipectomy bilaterally.  ATTENDING SURGEON:  Wayland Denis, DO  ANESTHESIA:  General and local with tumescent.  INDICATION FOR PROCEDURE:  The patient is a 51 year old female who underwent bilateral mastectomies for breast cancer.  She had excess tissue laterally as well as medially and the decision was made for excision.  Risks and complications were reviewed and included bleeding, pain, scar, risk of anesthesia, and discovery of a new cancer.  She was seen in holding.  Risks and complications were reviewed.  Consent signed.  The patient was marked.  DESCRIPTION OF PROCEDURE:  The patient was taken to the operating room, placed on the operating table in supine position.  General anesthesia was administered.  Once adequate, a time-out was called.  All information was confirmed to be correct.  She was prepped and draped in usual sterile fashion.  A 15-blade was used to make a stab incision at the axillary area at the lateral mastectomy scar site.  Tumescent was infused into the axillary tissue.  An additional stab incision was made medially at the site of the mastectomy scar in order to infuse the tumescent in the medial aspect of the breast.  The same procedure was done on both sides.  After waiting several minutes for the tumescent to have its effect, the suction device was inserted and used to extract the axillary soft tissue and adipose.   Approximately 150 mL of adipose was removed.  A 15 blade was used then to make the elliptical incision to remove the excess skin.  The deep layers were closed with 3-0 and 4-0 Vicryl and then a 5-0 Monocryl was used to reapproximate the skin edges. The same thing was done on both sides.  The left side had 200 mL of adipose removed.  Dermabond was applied.  Prior to closure, #7 drains were placed and secured to the skin with a 3-0 silk.  ABDs were placed with a breast binder.  The patient tolerated the procedure well.  There were no complications.  The lateral breast tissue for each side was sent and marked as well as the medial breast tissue to have pathologic review due to her history.    Wayland Denis, DO    CS/MEDQ  D:  11/17/2011  T:  11/18/2011  Job:  [id number]

## 2011-11-21 ENCOUNTER — Encounter (HOSPITAL_BASED_OUTPATIENT_CLINIC_OR_DEPARTMENT_OTHER): Payer: Self-pay | Admitting: Plastic Surgery

## 2011-11-25 ENCOUNTER — Encounter (HOSPITAL_BASED_OUTPATIENT_CLINIC_OR_DEPARTMENT_OTHER): Payer: Self-pay

## 2011-12-20 ENCOUNTER — Other Ambulatory Visit: Payer: 59 | Admitting: Lab

## 2011-12-27 ENCOUNTER — Ambulatory Visit: Payer: 59 | Admitting: Physician Assistant

## 2011-12-30 ENCOUNTER — Other Ambulatory Visit (HOSPITAL_BASED_OUTPATIENT_CLINIC_OR_DEPARTMENT_OTHER): Payer: 59 | Admitting: Lab

## 2011-12-30 DIAGNOSIS — C50919 Malignant neoplasm of unspecified site of unspecified female breast: Secondary | ICD-10-CM

## 2011-12-30 LAB — COMPREHENSIVE METABOLIC PANEL
Albumin: 4 g/dL (ref 3.5–5.2)
BUN: 23 mg/dL (ref 6–23)
Calcium: 9.1 mg/dL (ref 8.4–10.5)
Chloride: 103 mEq/L (ref 96–112)
Glucose, Bld: 110 mg/dL — ABNORMAL HIGH (ref 70–99)
Potassium: 4.5 mEq/L (ref 3.5–5.3)
Sodium: 138 mEq/L (ref 135–145)
Total Protein: 6.5 g/dL (ref 6.0–8.3)

## 2011-12-30 LAB — CBC WITH DIFFERENTIAL/PLATELET
Basophils Absolute: 0 10*3/uL (ref 0.0–0.1)
Eosinophils Absolute: 0.3 10*3/uL (ref 0.0–0.5)
HCT: 40.9 % (ref 34.8–46.6)
HGB: 13.8 g/dL (ref 11.6–15.9)
MONO#: 0.5 10*3/uL (ref 0.1–0.9)
NEUT#: 3 10*3/uL (ref 1.5–6.5)
NEUT%: 50.2 % (ref 38.4–76.8)
WBC: 6 10*3/uL (ref 3.9–10.3)
lymph#: 2.2 10*3/uL (ref 0.9–3.3)

## 2012-01-05 ENCOUNTER — Encounter: Payer: Self-pay | Admitting: Physician Assistant

## 2012-01-05 ENCOUNTER — Ambulatory Visit (HOSPITAL_BASED_OUTPATIENT_CLINIC_OR_DEPARTMENT_OTHER): Payer: 59 | Admitting: Physician Assistant

## 2012-01-05 ENCOUNTER — Ambulatory Visit (HOSPITAL_COMMUNITY)
Admission: RE | Admit: 2012-01-05 | Discharge: 2012-01-05 | Disposition: A | Discharge status: Home / Self Care | Payer: 59 | Source: Ambulatory Visit | Attending: Physician Assistant | Admitting: Physician Assistant

## 2012-01-05 ENCOUNTER — Telehealth: Payer: Self-pay | Admitting: *Deleted

## 2012-01-05 VITALS — BP 152/91 | HR 90 | Temp 98.2°F | Ht 64.0 in | Wt 216.5 lb

## 2012-01-05 DIAGNOSIS — Z87891 Personal history of nicotine dependence: Secondary | ICD-10-CM | POA: Insufficient documentation

## 2012-01-05 DIAGNOSIS — C50912 Malignant neoplasm of unspecified site of left female breast: Secondary | ICD-10-CM

## 2012-01-05 DIAGNOSIS — R911 Solitary pulmonary nodule: Secondary | ICD-10-CM | POA: Insufficient documentation

## 2012-01-05 DIAGNOSIS — C50919 Malignant neoplasm of unspecified site of unspecified female breast: Secondary | ICD-10-CM | POA: Insufficient documentation

## 2012-01-05 DIAGNOSIS — R059 Cough, unspecified: Secondary | ICD-10-CM | POA: Insufficient documentation

## 2012-01-05 DIAGNOSIS — R05 Cough: Secondary | ICD-10-CM | POA: Insufficient documentation

## 2012-01-05 HISTORY — DX: Malignant neoplasm of unspecified site of left female breast: C50.912

## 2012-01-05 MED ORDER — TAMOXIFEN CITRATE 20 MG PO TABS: 20.0000 mg | ORAL_TABLET | Freq: Every day | ORAL | Status: DC

## 2012-01-05 NOTE — Telephone Encounter (Signed)
made appointment Dr. Loreta Ave on 01-31-2012 at 8:30 am gave patient instructions on getting the chest x-ray done at [facility] made patient appointment for 01-2013 printed out calendar and gave to the patient

## 2012-01-05 NOTE — Progress Notes (Signed)
ID: Deanna Barry   DOB: May 07, 1961  MR#: [id number]  ION#:[id number]  HISTORY OF PRESENT ILLNESS: Deanna Barry had screening mammography August 2011, which noted dense breasts.  In addition, calcifications were noted on the left side and she was brought back for additional imaging May 07, 2010.  Dr Deanna Barry felt these were benign and he saw no change in breast architecture as compared to July 2009, which was the prior mammogram.  Accordingly, a 91-month repeat mammography for followup of the calcifications was scheduled.  This was performed November 23, 2010.  Dr Deanna Barry found that the previously noted calcifications were stable but that there was a partially circumscribed mass now noted in the lower inner quadrant of the left breast.  By ultrasound this was lobulated and hypoechoic and measured approximately 8 mm.  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  The tumor showed no HER-2/neu amplification, with a CIS ratio of 1.26.  It was strongly estrogen and progesterone receptor positive at 96% and 94% respectively.  The proliferation marker was 20%.    With this information, the patient was referred to our multidisciplinary clinic and bilateral breast MRIs were obtained November 30, 2010.  This showed the left breast mass to measure 1.7 cm and also noted a skin lesion or sebaceous epithelial inclusion cyst at the junction of the middle and posterior third of the breast, 4 o'clock position, associated with the skin.  There was an incidentally noted small right pleural effusion and a pulmonary nodule in the left upper lobe, which was seen on CT scan from July 2006 (measuring 8 mm at that time).    Patient underwent left mastectomy and sentinel lymph node sampling with simple right mastectomy in March 2012 for a T1CN0, grade 1,  invasive ductal carcinoma. Tumor was strongly ER and PR positive, HER-2/neu equivocal, with an Oncotype DX which quoted her a risk of recurrence at 10 years of 9% if she takes 5 years of tamoxifen. Patient was started on tamoxifen in May of 2012, with good tolerance.  Patient is known to be BRCA1 and BRCA2 negative.  INTERVAL HISTORY: Deanna Barry returns today for routine six-month followup of her left breast carcinoma. Interval history is unremarkable, and Deanna Barry is tolerating her tamoxifen well.  REVIEW OF SYSTEMS: She continues to have hot flashes which she says are "tolerable". She's had no vaginal dryness. No vaginal bleeding, and a menstrual cycle for her 1 year now. No peripheral swelling or evidence of abnormal clotting. No change in vision.  Deanna Barry does have known internal hemorrhoids, diagnosed with colonoscopy 10 years ago. She is noted a slight increase of blood in her stool recently, and would like to be scheduled for gastroenterology consult, and possible repeat colonoscopy. She has seen Dr. Loreta Barry in the past. She denies any abdominal pain. She's had no nausea or emesis. No reflux. No change in bowel habits, specifically no diarrhea or constipation.  A detailed review of systems is otherwise noncontributory.  PAST M EDICAL HISTORY: Past Medical History  Diagnosis Date  . Anemia     in the past, bruises easily  . Arthritis   . GERD (gastroesophageal reflux disease)   . Hypothyroidism   . Headache     hx. migraines  . Sleep apnea     no CPAP; occ. wakes up gasping/choking  . Hypertension  under control; has been on med. x 1-2 yrs.  . TMJ syndrome   . Abrasion of right knee 10/2011    fell on ice  . Complication of anesthesia 12/28/2010    woke up combative  . Breast cancer, left breast 01/05/2012    PAST SURGICAL HISTORY: Past Surgical History  Procedure Date  . Foot surgery     BOTH  . Forearm surgery     RIGHT  . Uvulopalatopharyngoplasty   . Dilation and curettage  of uterus 07/16/2004    hysteroscopy  . Thyroid lobectomy 04/14/2004    left; isthmusectomy  . Elbow arthroscopy 12/10/2003    left  . Mastectomy 12/28/2010    bilat.  . Breast reconstruction 11/17/2011    Procedure: BREAST RECONSTRUCTION;  Surgeon: Deanna Denis, DO;  Location: [facility];  Service: Plastics;  Laterality: Bilateral;  BILATERAL BREAST RECONSTRUCTION WITH EXCISON AND REPAIR EXCESS SKIN ON LATERAL ASPECT AND AXILLARY AREAST WITH SUCTION ASSISTED LIPECTOMY    FAMILY HISTORY Family History  Problem Relation Age of Onset  . Cancer Mother   . Cancer Maternal Grandmother     COLON  . Cancer Paternal Grandmother   . Cancer Paternal Grandfather    GYNECOLOGIC HISTORY:  The patient is GX, P0.  She had her first period at age 12.  Most recent period was November 25, 2010.  She is in the perimenopausal period and her cycles are currently fairly irregular.    SOCIAL HISTORY:  Deanna Barry works as a Midwife.  She lives with her partner, Deanna Barry.       ADVANCED DIRECTIVES:  HEALTH MAINTENANCE: History  Substance Use Topics  . Smoking status: Never Smoker   . Smokeless tobacco: Never Used  . Alcohol Use: Yes     socially     Colonoscopy:  PAP:  Bone density:  Lipid panel:  Allergies  Allergen Reactions  . Other Other (See Comments)    SCENTED LOTIONS - MIGRAINES  . Penicillins Cross Reactors Itching and Rash    Current Outpatient Prescriptions  Medication Sig Dispense Refill  . Ascorbic Acid (VITAMIN C PO) Take 240 mg by mouth daily.       Marland Kitchen atenolol (TENORMIN) 25 MG tablet Take 25 mg by mouth daily. AM      . ibuprofen (ADVIL,MOTRIN) 600 MG tablet Take 600 mg by mouth daily.      Marland Kitchen levothyroxine (SYNTHROID, LEVOTHROID) 25 MCG tablet Take 25 mcg by mouth daily. AM      . meloxicam (MOBIC) 15 MG tablet Take 15 mg by mouth daily.        . Multiple Vitamin (MULTIVITAMIN) capsule Take 1 capsule by mouth daily.        Marland Kitchen omeprazole (PRILOSEC) 20  MG capsule Take 20 mg by mouth daily. AM      . simvastatin (ZOCOR) 20 MG tablet Take 20 mg by mouth at bedtime.       . tamoxifen (NOLVADEX) 20 MG tablet Take 1 tablet (20 mg total) by mouth daily. AM  30 tablet  11  . venlafaxine (EFFEXOR) 75 MG tablet Take 75 mg by mouth daily. AM        OBJECTIVE: Filed Vitals:   01/05/12 0857  BP: 152/91  Pulse: 90  Temp: 98.2 F (36.8 C)     Body mass index is 37.16 kg/(m^2).    ECOG FS: 0  Physical Exam: HEENT:  Sclerae anicteric, conjunctivae pink.  Oropharynx clear.  No mucositis or candidiasis.  Nodes:  No cervical, supraclavicular, or axillary lymphadenopathy palpated.  Breast Exam:  Status post bilateral mastectomies, well-healed incisions. No suspicious nodularity or skin changes and no evidence of local recurrence on the left. Lungs:  Clear to auscultation bilaterally.  No crackles, rhonchi, or wheezes.   Heart:  Regular rate and rhythm.   Abdomen:  Soft, obese, nontender.  Positive bowel sounds.  No organomegaly or masses palpated.   Musculoskeletal:  No focal spinal tenderness to palpation.  Extremities:  Benign.  No peripheral edema or cyanosis.   Skin:  Benign.   Neuro:  Nonfocal. Alert and oriented x3.    LAB RESULTS: Lab Results  Component Value Date   WBC 6.0 12/30/2011   NEUTROABS 3.0 12/30/2011   HGB 13.8 12/30/2011   HCT 40.9 12/30/2011   MCV 87.6 12/30/2011   PLT 208 12/30/2011      Chemistry      Component Value Date/Time   NA 138 12/30/2011 0950   K 4.5 12/30/2011 0950   CL 103 12/30/2011 0950   CO2 28 12/30/2011 0950   BUN 23 12/30/2011 0950   CREATININE 0.79 12/30/2011 0950      Component Value Date/Time   CALCIUM 9.1 12/30/2011 0950   ALKPHOS 63 12/30/2011 0950   AST 24 12/30/2011 0950   ALT 29 12/30/2011 0950   BILITOT 0.5 12/30/2011 0950       Lab Results  Component Value Date   LABCA2 17 12/28/2010    STUDIES: Dg Chest 2 View  01/05/2012  *RADIOLOGY REPORT*  Clinical Data: History of left breast  carcinoma, cough, former smoking history  CHEST - 2 VIEW  Comparison: Chest x-ray of 12/28/2010 and CT chest of 04/12/2005  Findings: The previously noted left upper lobe lung nodule appears stable and therefore consistent with a benign process.  No active infiltrate or effusion is seen.  Mediastinal contours are stable. The heart is within upper limits of normal.  No acute bony abnormality is seen.  IMPRESSION: No active lung disease.  Stable benign appearing left upper lobe lung nodule.  Original Report Authenticated By: Juline Patch, M.D.    ASSESSMENT: A 51 year old [place] woman   (1)  status post left mastectomy and sentinel lymph node sampling with simple right mastectomy, March 2012, for a T1c N0 grade-1 invasive ductal carcinoma, strongly estrogen and progesterone receptor positive, HER-2 equivocal, with an Oncotype DX which quotes her a risk of recurrence at 10 years of 9% if she takes 5 years of tamoxifen.    (2)  She has been on tamoxifen since May 2012 with good tolerance, the goal being to continue tamoxifen for at least 2-3 years, then consider switching to an aromatase inhibitor.  (3)  She is known to be BRCA1 and BRCA2 negative.     PLAN: Deanna Barry will continue on tamoxifen which we have refilled today. As noted above, chest x-ray was unremarkable earlier today, and the known left upper lobe lung nodule remains stable.  Deanna Barry will be scheduled to see Dr. Darnelle Catalan and for consideration of colonoscopy. She will return to see [place] in 1 year, but knows to call prior that time with any changes. We will be checking her ovarian function when she returns in one year.   Deanna Barry    01/05/2012

## 2012-03-01 ENCOUNTER — Other Ambulatory Visit: Payer: Self-pay | Admitting: Oncology

## 2012-03-13 ENCOUNTER — Other Ambulatory Visit: Payer: Self-pay | Admitting: *Deleted

## 2012-03-13 MED ORDER — LEVOTHYROXINE SODIUM 25 MCG PO TABS: 25.0000 ug | ORAL_TABLET | Freq: Every day | ORAL | Status: DC

## 2012-03-13 MED ORDER — SIMVASTATIN 20 MG PO TABS: 20.0000 mg | ORAL_TABLET | Freq: Every day | ORAL | Status: DC

## 2012-03-13 NOTE — Telephone Encounter (Signed)
Pt called to inquire " could Dr Darnelle Catalan take over refilling all my medications ?"  This RN discussed with pt MD can only maintain medications that he is monitoring therapy relating to breast cancer history. Per this office pt is prescribed Effexor and tamoxifen.  Hamsini stated current situation where she needs refills on synthroid and simvastatin " I am completley out and prescribing MD won't refill until I am seen but she does not have openings at present ".  Per above refills will be given x 2 per courtesy to allow pt time to make appropriate follow up at primary MD.

## 2012-03-14 ENCOUNTER — Other Ambulatory Visit: Payer: Self-pay | Admitting: *Deleted

## 2012-03-14 MED ORDER — ATENOLOL 25 MG PO TABS: 25.0000 mg | ORAL_TABLET | Freq: Every day | ORAL | Status: DC

## 2012-03-14 NOTE — Telephone Encounter (Signed)
PT HAS REQUESTED COURTESY  REFILLS ON TENORMIN. THIS HAS BEEN FILLED WITH 1 REFILL. PT REPORTS THAT SHE HAS A F/U APPT WITH HER PCP FOR FUTER BP NEEDS

## 2012-10-09 ENCOUNTER — Encounter (INDEPENDENT_AMBULATORY_CARE_PROVIDER_SITE_OTHER): Payer: 59

## 2012-10-09 DIAGNOSIS — R52 Pain, unspecified: Secondary | ICD-10-CM

## 2012-10-09 DIAGNOSIS — M79609 Pain in unspecified limb: Secondary | ICD-10-CM

## 2012-10-09 DIAGNOSIS — M7989 Other specified soft tissue disorders: Secondary | ICD-10-CM

## 2012-11-08 ENCOUNTER — Other Ambulatory Visit: Payer: Self-pay | Admitting: *Deleted

## 2012-11-08 DIAGNOSIS — C50919 Malignant neoplasm of unspecified site of unspecified female breast: Secondary | ICD-10-CM

## 2012-11-08 MED ORDER — VENLAFAXINE HCL 75 MG PO TABS: 75.0000 mg | ORAL_TABLET | Freq: Every day | ORAL | Status: DC

## 2012-12-27 ENCOUNTER — Other Ambulatory Visit (HOSPITAL_BASED_OUTPATIENT_CLINIC_OR_DEPARTMENT_OTHER): Payer: 59 | Admitting: Lab

## 2012-12-27 DIAGNOSIS — C50919 Malignant neoplasm of unspecified site of unspecified female breast: Secondary | ICD-10-CM

## 2012-12-27 DIAGNOSIS — C50912 Malignant neoplasm of unspecified site of left female breast: Secondary | ICD-10-CM

## 2012-12-27 LAB — COMPREHENSIVE METABOLIC PANEL (CC13)
Albumin: 3.7 g/dL (ref 3.5–5.0)
CO2: 25 mEq/L (ref 22–29)
Calcium: 9.7 mg/dL (ref 8.4–10.4)
Glucose: 110 mg/dl — ABNORMAL HIGH (ref 70–99)
Potassium: 4 mEq/L (ref 3.5–5.1)
Sodium: 141 mEq/L (ref 136–145)
Total Protein: 7.1 g/dL (ref 6.4–8.3)

## 2012-12-27 LAB — CBC WITH DIFFERENTIAL/PLATELET
Basophils Absolute: 0 10*3/uL (ref 0.0–0.1)
EOS%: 2.5 % (ref 0.0–7.0)
HGB: 15.1 g/dL (ref 11.6–15.9)
LYMPH%: 36.5 % (ref 14.0–49.7)
MCH: 30 pg (ref 25.1–34.0)
MCV: 88.1 fL (ref 79.5–101.0)
MONO%: 8.2 % (ref 0.0–14.0)
Platelets: 244 10*3/uL (ref 145–400)
RDW: 12.5 % (ref 11.2–14.5)

## 2012-12-27 LAB — FOLLICLE STIMULATING HORMONE: FSH: 24 m[IU]/mL

## 2013-01-03 ENCOUNTER — Telehealth: Payer: Self-pay | Admitting: Oncology

## 2013-01-03 ENCOUNTER — Ambulatory Visit (HOSPITAL_BASED_OUTPATIENT_CLINIC_OR_DEPARTMENT_OTHER): Payer: 59 | Admitting: Oncology

## 2013-01-03 VITALS — BP 129/89 | HR 87 | Temp 100.1°F | Resp 20 | Ht 64.0 in | Wt 225.3 lb

## 2013-01-03 DIAGNOSIS — Z9012 Acquired absence of left breast and nipple: Secondary | ICD-10-CM

## 2013-01-03 DIAGNOSIS — Z170 Estrogen receptor positive status [ER+]: Secondary | ICD-10-CM

## 2013-01-03 DIAGNOSIS — C50912 Malignant neoplasm of unspecified site of left female breast: Secondary | ICD-10-CM

## 2013-01-03 DIAGNOSIS — C50919 Malignant neoplasm of unspecified site of unspecified female breast: Secondary | ICD-10-CM

## 2013-01-03 MED ORDER — TAMOXIFEN CITRATE 20 MG PO TABS: 20.0000 mg | ORAL_TABLET | Freq: Every day | ORAL | Status: DC

## 2013-01-03 NOTE — Progress Notes (Signed)
ID: Adele Dan   DOB: 1961-02-06  MR#: [id number]  WUJ#:[id number]  HISTORY OF PRESENT ILLNESS: Eunice Blase had screening mammography August 2011, which noted dense breasts.  In addition, calcifications were noted on the left side and she was brought back for additional imaging May 07, 2010.  Dr Samuella Cota felt these were benign and he saw no change in breast architecture as compared to July 2009, which was the prior mammogram.  Accordingly, a 34-month repeat mammography for followup of the calcifications was scheduled.  This was performed November 23, 2010.  Dr Tilda Burrow found that the previously noted calcifications were stable but that there was a partially circumscribed mass now noted in the lower inner quadrant of the left breast.  By ultrasound this was lobulated and hypoechoic and measured approximately 8 mm.  Accordingly, the patient was brought back for ultrasound-guided biopsy November 25, 2010, and this showed [phone]) an invasive ductal carcinoma that appeared to be low grade and was associated with abundant extracellular mucin.  The tumor showed no HER-2/neu amplification, with a CIS ratio of 1.26.  It was strongly estrogen and progesterone receptor positive at 96% and 94% respectively.  The proliferation marker was 20%.    With this information, the patient was referred to our multidisciplinary clinic and bilateral breast MRIs were obtained November 30, 2010.  This showed the left breast mass to measure 1.7 cm and also noted a skin lesion or sebaceous epithelial inclusion cyst at the junction of the middle and posterior third of the breast, 4 o'clock position, associated with the skin.  There was an incidentally noted small right pleural effusion and a pulmonary nodule in the left upper lobe, which was seen on CT scan from July 2006 (measuring 8 mm at that time).    Patient underwent left mastectomy and sentinel lymph node sampling with simple right mastectomy in March 2012 for a T1CN0, grade 1,  invasive ductal carcinoma. Tumor was strongly ER and PR positive, HER-2/neu equivocal, with an Oncotype DX which quoted her a risk of recurrence at 10 years of 9% if she takes 5 years of tamoxifen. Patient was started on tamoxifen in May of 2012, with good tolerance.  Patient is known to be BRCA1 and BRCA2 negative.  INTERVAL HISTORY: Eunice Blase returns today for followup of her left breast carcinoma. Interval history is significant for her having undergone knee surgery November of 2013. 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  REVIEW OF SYSTEMS: She is tolerating the tamoxifen well, with mild hot flashes, no vaginal wetness. She has mild migraines which she treats with a rim index, and some urinary dribbling. Otherwise a detailed review of systems today was noncontributory.  PAST M EDICAL HISTORY: Past Medical History  Diagnosis Date  . Anemia     in the past, bruises easily  . Arthritis   . GERD (gastroesophageal reflux disease)   . Hypothyroidism   . Headache     hx. migraines  . Sleep apnea     no CPAP; occ. wakes up gasping/choking  . Hypertension     under control; has been on med. x 1-2 yrs.  . TMJ syndrome   . Abrasion of right knee 10/2011    fell on ice  . Complication of anesthesia 12/28/2010    woke up combative  . Breast cancer, left breast 01/05/2012    PAST SURGICAL HISTORY: Past Surgical History  Procedure  Laterality Date  . Foot surgery      BOTH  . Forearm surgery      RIGHT  . Uvulopalatopharyngoplasty    . Dilation and curettage of uterus  07/16/2004    hysteroscopy  . Thyroid lobectomy  04/14/2004    left; isthmusectomy  . Elbow arthroscopy  12/10/2003    left  . Mastectomy  12/28/2010    bilat.  . Breast reconstruction  11/17/2011    Procedure: BREAST RECONSTRUCTION;  Surgeon: Wayland Denis, DO;  Location: [facility];  Service: Plastics;  Laterality: Bilateral;   BILATERAL BREAST RECONSTRUCTION WITH EXCISON AND REPAIR EXCESS SKIN ON LATERAL ASPECT AND AXILLARY AREAST WITH SUCTION ASSISTED LIPECTOMY    FAMILY HISTORY Family History  Problem Relation Age of Onset  . Cancer Mother   . Cancer Maternal Grandmother     COLON  . Cancer Paternal Grandmother   . Cancer Paternal Grandfather    GYNECOLOGIC HISTORY:  The patient is GX, P0.  She had her first period at age 36.  Most recent period was November 25, 2010.    SOCIAL HISTORY:  Ninette works as a Midwife.  She lives with her partner, Anna Genre.       ADVANCED DIRECTIVES: Not in place  HEALTH MAINTENANCE: History  Substance Use Topics  . Smoking status: Never Smoker   . Smokeless tobacco: Never Used  . Alcohol Use: Yes     Comment: socially     Colonoscopy: 2013/ [facility]  PAP:  Bone density:  Lipid panel:  Allergies  Allergen Reactions  . Other Other (See Comments)    SCENTED LOTIONS - MIGRAINES  . Penicillins Cross Reactors Itching and Rash    Current Outpatient Prescriptions  Medication Sig Dispense Refill  . Ascorbic Acid (VITAMIN C PO) Take 240 mg by mouth daily.       Marland Kitchen atenolol (TENORMIN) 25 MG tablet Take 1 tablet (25 mg total) by mouth daily. AM  30 tablet  1  . ibuprofen (ADVIL,MOTRIN) 600 MG tablet Take 600 mg by mouth daily.      Marland Kitchen levothyroxine (SYNTHROID, LEVOTHROID) 25 MCG tablet Take 1 tablet (25 mcg total) by mouth daily. AM  30 tablet  2  . meloxicam (MOBIC) 15 MG tablet Take 15 mg by mouth daily.        . Multiple Vitamin (MULTIVITAMIN) capsule Take 1 capsule by mouth daily.        Marland Kitchen omeprazole (PRILOSEC) 20 MG capsule Take 20 mg by mouth daily. AM      . simvastatin (ZOCOR) 20 MG tablet Take 1 tablet (20 mg total) by mouth at bedtime.  30 tablet  2  . tamoxifen (NOLVADEX) 20 MG tablet Take 1 tablet (20 mg total) by mouth daily. AM  30 tablet  11  . venlafaxine (EFFEXOR) 75 MG tablet Take 1 tablet (75 mg total) by mouth daily. AM  60 tablet  0   No  current facility-administered medications for this visit.    OBJECTIVE: Middle-aged white woman who appears well Filed Vitals:   01/03/13 0911  BP: 129/89  Pulse: 87  Temp: 100.1 F (37.8 C)  Resp: 20     Body mass index is 38.65 kg/(m^2).    ECOG FS: 0 Sclerae unicteric Oropharynx clear No cervical or supraclavicular adenopathy Lungs no rales or rhonchi Heart regular rate and rhythm Abd soft, obese, nontender, positive bowel sounds MSK no focal spinal tenderness, no peripheral edema Neuro: nonfocal, well oriented, pleasant affect  Breasts: Status post bilateral mastectomies. There is no evidence of local recurrence. Both axillae are benign.       LAB RESULTS: Lab Results  Component Value Date   WBC 8.4 12/27/2012   NEUTROABS 4.4 12/27/2012   HGB 15.1 12/27/2012   HCT 44.4 12/27/2012   MCV 88.1 12/27/2012   PLT 244 12/27/2012      Chemistry      Component Value Date/Time   NA 141 12/27/2012 0728   NA 138 12/30/2011 0950   K 4.0 12/27/2012 0728   K 4.5 12/30/2011 0950   CL 106 12/27/2012 0728   CL 103 12/30/2011 0950   CO2 25 12/27/2012 0728   CO2 28 12/30/2011 0950   BUN 18.6 12/27/2012 0728   BUN 23 12/30/2011 0950   CREATININE 0.8 12/27/2012 0728   CREATININE 0.79 12/30/2011 0950      Component Value Date/Time   CALCIUM 9.7 12/27/2012 0728   CALCIUM 9.1 12/30/2011 0950   ALKPHOS 68 12/27/2012 0728   ALKPHOS 63 12/30/2011 0950   AST 26 12/27/2012 0728   AST 24 12/30/2011 0950   ALT 40 12/27/2012 0728   ALT 29 12/30/2011 0950   BILITOT 0.61 12/27/2012 0728   BILITOT 0.5 12/30/2011 0950       Lab Results  Component Value Date   LABCA2 21 12/27/2012    STUDIES: No results found.  ASSESSMENT: 52 y.o.  BRCA negative [place] woman   (1)  status post left mastectomy and sentinel lymph node sampling with simple right mastectomy, March 2012, for a T1c N0 grade-1 invasive ductal carcinoma, strongly estrogen and progesterone receptor positive, HER-2 was 1.26 and 2.04  ("equivocal" per 2012 guidelines) on separate determinations  (2) Oncotype DX which quotes her a risk of recurrence at 10 years of 9% if her only systemic therapy is 5 years of tamoxifen. (HER-2 was also negative per Oncotype)    (3)  on tamoxifen as of May 2012   PLAN: We spent the better part of today's visit discussing the possible change to an aromatase inhibitor. She understands that 5 years of tamoxifen alone is suboptimal. Her choice is to switch to an aromatase inhibitor and stay on that for an additional 3 years, or continue on tamoxifen for an additional 8 years.  After much discussion she decided to stay on tamoxifen. She is very concerned regarding bone loss on aromatase inhibitors, and appreciates the possibility of using localized estrogen preparations for vaginal dryness while on tamoxifen (although she is not using that option at present).  Accordingly the plan is to continue tamoxifen for a total of 10 years. She will see [place] yearly. She knows to call for any problems that may develop before the next visit.  Eryanna Regal C    01/03/2013

## 2013-01-03 NOTE — Telephone Encounter (Signed)
gv pt appt schedule for April 2015.  °

## 2013-01-07 ENCOUNTER — Other Ambulatory Visit: Payer: Self-pay | Admitting: *Deleted

## 2013-01-07 DIAGNOSIS — R232 Flushing: Secondary | ICD-10-CM

## 2013-01-07 MED ORDER — VENLAFAXINE HCL 75 MG PO TABS: 75.0000 mg | ORAL_TABLET | Freq: Every day | ORAL | Status: DC

## 2013-05-31 ENCOUNTER — Telehealth: Payer: Self-pay | Admitting: *Deleted

## 2013-05-31 NOTE — Telephone Encounter (Signed)
Lm informed the pt that Amy will be on pal 01/02/14. gv appt for 01/09/14 w/labs@ 8:15am and ov@ 8:45am. Pt is aware that i will mail a letter/avs...td

## 2013-07-30 ENCOUNTER — Telehealth: Payer: Self-pay | Admitting: *Deleted

## 2013-07-30 ENCOUNTER — Other Ambulatory Visit (HOSPITAL_BASED_OUTPATIENT_CLINIC_OR_DEPARTMENT_OTHER): Payer: 59 | Admitting: Lab

## 2013-07-30 ENCOUNTER — Other Ambulatory Visit: Payer: Self-pay | Admitting: *Deleted

## 2013-07-30 DIAGNOSIS — R309 Painful micturition, unspecified: Secondary | ICD-10-CM

## 2013-07-30 DIAGNOSIS — R300 Dysuria: Secondary | ICD-10-CM

## 2013-07-30 LAB — URINALYSIS, MICROSCOPIC - CHCC
Bilirubin (Urine): NEGATIVE
Glucose: NEGATIVE mg/dL
Leukocyte Esterase: NEGATIVE
Nitrite: NEGATIVE

## 2013-07-30 NOTE — Telephone Encounter (Signed)
Pt called to this RN to state onset on ongoing bladder and urinary issues that have been worked up by primary and urologist with negative findings.  Today Deanna Barry had to leave work due to discomfort - burning, urgency as well as episode of incontinence.  " Dr Darnelle Catalan knows my family history and told me to call if I had issues"  At present above issues discussed and pt will obtain OTC AZO standard for bladder analgesic. She will come in to this office to give a urine sample.

## 2013-07-30 NOTE — Telephone Encounter (Signed)
Post review of pt's call and U/A result - MD requested visit to discuss symptoms and possible interventions.  Note U/A not conclusive for UTI- culture is pending.  This RN spoke with pt who stated some benefit with the AZO standard. Scheduled patient per discussion for follow up 10/29 with MD.

## 2013-07-31 ENCOUNTER — Encounter: Payer: Self-pay | Admitting: Oncology

## 2013-07-31 ENCOUNTER — Ambulatory Visit (HOSPITAL_BASED_OUTPATIENT_CLINIC_OR_DEPARTMENT_OTHER): Payer: 59 | Admitting: Oncology

## 2013-07-31 ENCOUNTER — Other Ambulatory Visit: Payer: Self-pay | Admitting: Oncology

## 2013-07-31 VITALS — BP 121/80 | HR 71 | Temp 98.7°F | Resp 19 | Ht 64.0 in | Wt 204.9 lb

## 2013-07-31 DIAGNOSIS — C50912 Malignant neoplasm of unspecified site of left female breast: Secondary | ICD-10-CM

## 2013-07-31 DIAGNOSIS — Z9010 Acquired absence of unspecified breast and nipple: Secondary | ICD-10-CM

## 2013-07-31 DIAGNOSIS — C50919 Malignant neoplasm of unspecified site of unspecified female breast: Secondary | ICD-10-CM

## 2013-07-31 NOTE — Progress Notes (Signed)
ID: Adele Dan   DOB: 03-31-1961  MR#: [id number]  WUJ#:[id number]  PCP: Elie Confer, MD GYN: Maxie Better SU:  OTHER MD: Alfredo Martinez   HISTORY OF PRESENT ILLNESS: Deanna Barry had screening mammography August 2011, which noted dense breasts.  In addition, calcifications were noted on the left side and she was brought back for additional imaging May 07, 2010.  Dr Samuella Cota felt these were benign and he saw no change in breast architecture as compared to July 2009, which was the prior mammogram.  Accordingly, a 68-month repeat mammography for followup of the calcifications was scheduled.  This was performed November 23, 2010.  Dr Tilda Burrow found that the previously noted calcifications were stable but that there was a partially circumscribed mass now noted in the lower inner quadrant of the left breast.  By ultrasound this was lobulated and hypoechoic and measured approximately 8 mm.  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  The tumor showed no HER-2/neu amplification, with a CIS ratio of 1.26.  It was strongly estrogen and progesterone receptor positive at 96% and 94% respectively.  The proliferation marker was 20%.    With this information, the patient was referred to our multidisciplinary clinic and bilateral breast MRIs were obtained November 30, 2010.  This showed the left breast mass to measure 1.7 cm and also noted a skin lesion or sebaceous epithelial inclusion cyst at the junction of the middle and posterior third of the breast, 4 o'clock position, associated with the skin.  There was an incidentally noted small right pleural effusion and a pulmonary nodule in the left upper lobe, which was seen on CT scan from July 2006 (measuring 8 mm at that time).    Patient underwent left mastectomy and  sentinel lymph node sampling with simple right mastectomy in March 2012 for a T1CN0, grade 1, invasive ductal carcinoma. Tumor was strongly ER and PR positive, HER-2/neu equivocal, with an Oncotype DX which quoted her a risk of recurrence at 10 years of 9% if she takes 5 years of tamoxifen. Patient was started on tamoxifen in May of 2012, with good tolerance.  Patient is known to be BRCA1 and BRCA2 negative.  INTERVAL HISTORY: Eunice Blase returns today for an unscheduled visit. She has been having some unusual pelvic problems going on for some weeks now. Specifically she developed right lower pelvic pain and some urinary symptoms and brought this to the attention of Dr. Gerri Spore. According to the patient, a urinalysis was obtained and showed a "minor" culture. She was treated with antibiotics, but the symptoms did not resolve. Specifically she continued to feel like she had to urinate even though there really was no urine there. There was no dysuria or hematuria. She would have cramps occasionally. This did not feel like premenstrual cramps (she still has her ovaries in place, but has not had a period for a long time.  As his symptoms persisted she saw Dr. McDiarmid and according to the patient he scoped her and found no abnormality. He gave her some samples of medication which was not helpful. Yesterday the patient had similar symptoms, with cramps and dysuria and she called our office. We asked her to come in for urinalysis. She came today to discuss those results.  REVIEW OF SYSTEMS: Aside from the above, she is tolerating the tamoxifen well, with mild hot flashes,  no vaginal wetness. She has mild migraines which she treats with a rim index, and some urinary dribbling. Otherwise a detailed review of systems today was noncontributory.  PAST M EDICAL HISTORY: Past Medical History  Diagnosis Date  . Anemia     in the past, bruises easily  . Arthritis   . GERD (gastroesophageal reflux disease)   .  Hypothyroidism   . Headache     hx. migraines  . Sleep apnea     no CPAP; occ. wakes up gasping/choking  . Hypertension     under control; has been on med. x 1-2 yrs.  . TMJ syndrome   . Abrasion of right knee 10/2011    fell on ice  . Complication of anesthesia 12/28/2010    woke up combative  . Breast cancer, left breast 01/05/2012    PAST SURGICAL HISTORY: Past Surgical History  Procedure Laterality Date  . Foot surgery      BOTH  . Forearm surgery      RIGHT  . Uvulopalatopharyngoplasty    . Dilation and curettage of uterus  07/16/2004    hysteroscopy  . Thyroid lobectomy  04/14/2004    left; isthmusectomy  . Elbow arthroscopy  12/10/2003    left  . Mastectomy  12/28/2010    bilat.  . Breast reconstruction  11/17/2011    Procedure: BREAST RECONSTRUCTION;  Surgeon: Wayland Denis, DO;  Location: [facility];  Service: Plastics;  Laterality: Bilateral;  BILATERAL BREAST RECONSTRUCTION WITH EXCISON AND REPAIR EXCESS SKIN ON LATERAL ASPECT AND AXILLARY AREAST WITH SUCTION ASSISTED LIPECTOMY    FAMILY HISTORY Family History  Problem Relation Age of Onset  . Cancer Mother   . Cancer Maternal Grandmother     COLON  . Cancer Paternal Grandmother   . Cancer Paternal Grandfather    GYNECOLOGIC HISTORY:  The patient is GX, P0.  She had her first period at age 33.  Most recent period was November 25, 2010.    SOCIAL HISTORY:  Gianne works as a Midwife.  She lives with her partner, Anna Genre.       ADVANCED DIRECTIVES: Not in place  HEALTH MAINTENANCE: History  Substance Use Topics  . Smoking status: Never Smoker   . Smokeless tobacco: Never Used  . Alcohol Use: Yes     Comment: socially     Colonoscopy: 2013/ [facility]  PAP:  Bone density:  Lipid panel:  Allergies  Allergen Reactions  . Other Other (See Comments)    SCENTED LOTIONS - MIGRAINES  . Penicillins Cross Reactors Itching and Rash    Current Outpatient Prescriptions  Medication Sig  Dispense Refill  . Ascorbic Acid (VITAMIN C PO) Take 240 mg by mouth daily.       Marland Kitchen atenolol (TENORMIN) 25 MG tablet Take 1 tablet (25 mg total) by mouth daily. AM  30 tablet  1  . ibuprofen (ADVIL,MOTRIN) 600 MG tablet Take 600 mg by mouth daily.      Marland Kitchen levothyroxine (SYNTHROID, LEVOTHROID) 25 MCG tablet Take 1 tablet (25 mcg total) by mouth daily. AM  30 tablet  2  . meloxicam (MOBIC) 15 MG tablet Take 15 mg by mouth daily.        . Multiple Vitamin (MULTIVITAMIN) capsule Take 1 capsule by mouth daily.        Marland Kitchen omeprazole (PRILOSEC) 20 MG capsule Take 20 mg by mouth daily. AM      . simvastatin (ZOCOR) 20 MG tablet Take 1 tablet (  20 mg total) by mouth at bedtime.  30 tablet  2  . tamoxifen (NOLVADEX) 20 MG tablet Take 1 tablet (20 mg total) by mouth daily. AM  90 tablet  12  . tamoxifen (NOLVADEX) 20 MG tablet Take 1 tablet (20 mg total) by mouth daily. AM  90 tablet  12  . venlafaxine (EFFEXOR) 75 MG tablet Take 1 tablet (75 mg total) by mouth daily. AM  60 tablet  11   No current facility-administered medications for this visit.    OBJECTIVE: Exam today was deferred Filed Vitals:   07/31/13 1431  BP: 121/80  Pulse: 71  Temp: 98.7 F (37.1 C)  Resp: 19     Body mass index is 35.15 kg/(m^2).     LAB RESULTS:   Urinalysis has a negative leukocyte esterase and negative WBC    Component Value Date/Time   LABSPEC 1.030 07/30/2013 1351   GLUCOSEU Negative 07/30/2013 1351   UROBILINOGEN 0.2 07/30/2013 1351     Lab Results  Component Value Date   WBC 8.4 12/27/2012   NEUTROABS 4.4 12/27/2012   HGB 15.1 12/27/2012   HCT 44.4 12/27/2012   MCV 88.1 12/27/2012   PLT 244 12/27/2012      Chemistry      Component Value Date/Time   NA 141 12/27/2012 0728   NA 138 12/30/2011 0950   K 4.0 12/27/2012 0728   K 4.5 12/30/2011 0950   CL 106 12/27/2012 0728   CL 103 12/30/2011 0950   CO2 25 12/27/2012 0728   CO2 28 12/30/2011 0950   BUN 18.6 12/27/2012 0728   BUN 23 12/30/2011 0950    CREATININE 0.8 12/27/2012 0728   CREATININE 0.79 12/30/2011 0950      Component Value Date/Time   CALCIUM 9.7 12/27/2012 0728   CALCIUM 9.1 12/30/2011 0950   ALKPHOS 68 12/27/2012 0728   ALKPHOS 63 12/30/2011 0950   AST 26 12/27/2012 0728   AST 24 12/30/2011 0950   ALT 40 12/27/2012 0728   ALT 29 12/30/2011 0950   BILITOT 0.61 12/27/2012 0728   BILITOT 0.5 12/30/2011 0950       Lab Results  Component Value Date   LABCA2 21 12/27/2012    STUDIES: No results found.   ASSESSMENT: 52 y.o.  BRCA negative [place] woman   (1)  status post left mastectomy and sentinel lymph node sampling with simple right mastectomy, March 2012, for a T1c N0 grade1 invasive ductal carcinoma, strongly estrogen and progesterone receptor positive, HER-2 was 1.26 and 2.04 ("equivocal" per 2012 guidelines) on separate determinations  (2) Oncotype DX which quotes her a risk of recurrence at 10 years of 9% if her only systemic therapy is 5 years of tamoxifen. (HER-2 was also negative per Oncotype)    (3)  on tamoxifen as of May 2012   PLAN: I am not sure exactly what is causing Remmi's symptoms, but it is not a urinary infection or kidney stone, it is not related to the tamoxifen, and she had a negative bladder scope under urology. I wonder if something is irritating the bladder from the outside. We could evaluate this either with a transvaginal ultrasound or pelvic ultrasound or pelvic CT scan.  She tells me she is already scheduled to see Dr. cousins within the next 2 weeks. Perhaps Dr. cousins could do an ultrasound and see if there is any mass or suspicious finding irritating the bladder. Otherwise we could proceed to a CT of the pelvis if the ultrasound  is noninformative.  Patrick is in agreement with this plan. She knows to call for any problems that may develop before her next visit here, which will be in 6 months. Demonta Wombles C    07/31/2013

## 2013-08-01 ENCOUNTER — Telehealth: Payer: Self-pay | Admitting: Oncology

## 2013-08-01 NOTE — Telephone Encounter (Signed)
Sent letter to Dr. Nelta Numbers office from Dr. Bevelyn Buckles.

## 2013-08-03 ENCOUNTER — Other Ambulatory Visit: Payer: Self-pay | Admitting: Oncology

## 2013-12-17 ENCOUNTER — Telehealth: Payer: Self-pay | Admitting: Physician Assistant

## 2013-12-17 ENCOUNTER — Other Ambulatory Visit: Payer: Self-pay | Admitting: Physician Assistant

## 2013-12-17 NOTE — Telephone Encounter (Signed)
, °

## 2013-12-30 ENCOUNTER — Other Ambulatory Visit: Payer: Self-pay | Admitting: Oncology

## 2013-12-31 ENCOUNTER — Telehealth: Payer: Self-pay | Admitting: Oncology

## 2013-12-31 NOTE — Telephone Encounter (Signed)
, °

## 2014-01-02 ENCOUNTER — Other Ambulatory Visit: Payer: 59

## 2014-01-02 ENCOUNTER — Other Ambulatory Visit: Payer: 59 | Admitting: Lab

## 2014-01-02 ENCOUNTER — Ambulatory Visit: Payer: 59

## 2014-01-02 ENCOUNTER — Ambulatory Visit: Payer: 59 | Admitting: Physician Assistant

## 2014-01-09 ENCOUNTER — Ambulatory Visit: Payer: 59

## 2014-01-09 ENCOUNTER — Other Ambulatory Visit: Payer: 59

## 2014-01-09 ENCOUNTER — Ambulatory Visit: Payer: 59 | Admitting: Physician Assistant

## 2014-01-31 ENCOUNTER — Other Ambulatory Visit: Payer: Self-pay

## 2014-01-31 ENCOUNTER — Ambulatory Visit (INDEPENDENT_AMBULATORY_CARE_PROVIDER_SITE_OTHER): Payer: 59

## 2014-01-31 ENCOUNTER — Ambulatory Visit (INDEPENDENT_AMBULATORY_CARE_PROVIDER_SITE_OTHER): Payer: Self-pay | Admitting: *Deleted

## 2014-01-31 VITALS — BP 132/83 | HR 83 | Resp 16 | Ht 64.0 in | Wt 210.0 lb

## 2014-01-31 DIAGNOSIS — M722 Plantar fascial fibromatosis: Secondary | ICD-10-CM

## 2014-01-31 DIAGNOSIS — M79609 Pain in unspecified limb: Secondary | ICD-10-CM

## 2014-01-31 MED ORDER — MELOXICAM 15 MG PO TABS: 15.0000 mg | ORAL_TABLET | Freq: Every day | ORAL | Status: DC

## 2014-01-31 NOTE — Patient Instructions (Signed)

## 2014-01-31 NOTE — Progress Notes (Signed)
   Subjective:    Patient ID: Deanna Barry, female    DOB: December 12, 1960, 53 y.o.   MRN: [id number]  HPI FEET STRAPPING   Review of Systems     Objective:   Physical Exam        Assessment & Plan:   STRAPPING

## 2014-01-31 NOTE — Progress Notes (Signed)
   Subjective:    Patient ID: Deanna Barry, female    DOB: 1960/11/07, 53 y.o.   MRN: [id number]  HPI Comments: "I have had problems with plantar fasciitis for years"  Patient c/o aching plantar heel bilateral for 3 months. She has had problems with heels for several years, off and on. She does have AM pain and after sitting. She has had EPF bilateral. She does not wear her orthotics anymore. She's tried stretching, ice, and Aleve. No help.  Foot Pain      Review of Systems  Musculoskeletal: Positive for gait problem.  All other systems reviewed and are negative.      Objective:   Physical Exam Neurovascular status is intact with pedal pulses palpable DP and PT +2/4 bilateral capillary fill time 3 seconds all digits epicritic and proprioceptive sensations intact and symmetric bilateral. Patient is a history of previous heel spur surgery done 10 years ago has orthotics are done even prior to that but are worn out she is no longer be wearing those patient is pain on first up in the morning or getting up from. Rest both heels painful and symptomatic right more significant than left patient also has a hallux rigidus with osteoarthropathy first MTP joint right which may require surgical intervention some point the summer. Patient's heel pain has gotten worse patient also has some arthropathy in her knee which can have surgery some point in the near future she is wearing a valgus wedge in her shoes to prevent medial nail compression. Can continue to maintain a valgus wedge with orthoses. Orthopedic exam reveals well-developed inferior spurs left more so than right. There is thickening plantar fascial structures consistent with plantar fasciitis/heel spur syndrome no other osseous abnormalities noted skin texture turgor otherwise unremarkable absent hair growth noted       Assessment & Plan:  Assessment plantar fasciitis/heel spur syndrome bilateral right more so than left at this time  orthotics skin is carried out in new functional orthoses will be ordered for the patient also at this time patient is placed in fascial strapping both feet and a prescription for meloxicam 15 mg once daily is dispensed with instructions to followup with in 2-3 weeks for orthotic pickup and fitting maintain ice as needed to keep inflammation down the heel maintain a stable shoe at all times in the interim. Followup in likely 3 weeks for orthotic fitting and pick up next  Harriet Masson DPM

## 2014-02-04 ENCOUNTER — Other Ambulatory Visit: Payer: Self-pay | Admitting: *Deleted

## 2014-02-04 DIAGNOSIS — C50912 Malignant neoplasm of unspecified site of left female breast: Secondary | ICD-10-CM

## 2014-02-05 ENCOUNTER — Ambulatory Visit: Payer: 59

## 2014-02-05 ENCOUNTER — Ambulatory Visit (HOSPITAL_BASED_OUTPATIENT_CLINIC_OR_DEPARTMENT_OTHER): Payer: 59 | Admitting: Hematology and Oncology

## 2014-02-05 ENCOUNTER — Other Ambulatory Visit: Payer: 59

## 2014-02-05 ENCOUNTER — Other Ambulatory Visit: Payer: Self-pay

## 2014-02-05 ENCOUNTER — Other Ambulatory Visit (HOSPITAL_BASED_OUTPATIENT_CLINIC_OR_DEPARTMENT_OTHER): Payer: 59

## 2014-02-05 ENCOUNTER — Telehealth: Payer: Self-pay | Admitting: Oncology

## 2014-02-05 VITALS — BP 128/87 | HR 89 | Temp 98.4°F | Resp 18 | Ht 64.0 in | Wt 214.9 lb

## 2014-02-05 DIAGNOSIS — Z170 Estrogen receptor positive status [ER+]: Secondary | ICD-10-CM

## 2014-02-05 DIAGNOSIS — C50912 Malignant neoplasm of unspecified site of left female breast: Secondary | ICD-10-CM

## 2014-02-05 DIAGNOSIS — C50919 Malignant neoplasm of unspecified site of unspecified female breast: Secondary | ICD-10-CM

## 2014-02-05 DIAGNOSIS — R232 Flushing: Secondary | ICD-10-CM

## 2014-02-05 LAB — CBC WITH DIFFERENTIAL/PLATELET
BASO%: 0.7 % (ref 0.0–2.0)
BASOS ABS: 0 10*3/uL (ref 0.0–0.1)
EOS%: 3.9 % (ref 0.0–7.0)
Eosinophils Absolute: 0.2 10*3/uL (ref 0.0–0.5)
HCT: 41.3 % (ref 34.8–46.6)
HGB: 14.2 g/dL (ref 11.6–15.9)
LYMPH#: 2.5 10*3/uL (ref 0.9–3.3)
LYMPH%: 43.1 % (ref 14.0–49.7)
MCH: 29.1 pg (ref 25.1–34.0)
MCHC: 34.5 g/dL (ref 31.5–36.0)
MCV: 84.4 fL (ref 79.5–101.0)
MONO#: 0.5 10*3/uL (ref 0.1–0.9)
MONO%: 9.3 % (ref 0.0–14.0)
NEUT%: 43 % (ref 38.4–76.8)
NEUTROS ABS: 2.5 10*3/uL (ref 1.5–6.5)
Platelets: 227 10*3/uL (ref 145–400)
RBC: 4.89 10*6/uL (ref 3.70–5.45)
RDW: 13.1 % (ref 11.2–14.5)
WBC: 5.7 10*3/uL (ref 3.9–10.3)

## 2014-02-05 LAB — COMPREHENSIVE METABOLIC PANEL (CC13)
ALT: 24 U/L (ref 0–55)
AST: 22 U/L (ref 5–34)
Albumin: 3.6 g/dL (ref 3.5–5.0)
Alkaline Phosphatase: 68 U/L (ref 40–150)
Anion Gap: 10 mEq/L (ref 3–11)
BUN: 16.3 mg/dL (ref 7.0–26.0)
CALCIUM: 9.4 mg/dL (ref 8.4–10.4)
CHLORIDE: 110 meq/L — AB (ref 98–109)
CO2: 23 meq/L (ref 22–29)
Creatinine: 0.8 mg/dL (ref 0.6–1.1)
Glucose: 111 mg/dl (ref 70–140)
Potassium: 4.4 mEq/L (ref 3.5–5.1)
Sodium: 144 mEq/L (ref 136–145)
Total Bilirubin: 0.47 mg/dL (ref 0.20–1.20)
Total Protein: 6.9 g/dL (ref 6.4–8.3)

## 2014-02-05 MED ORDER — VENLAFAXINE HCL 75 MG PO TABS: 75.0000 mg | ORAL_TABLET | Freq: Every day | ORAL | Status: DC

## 2014-02-05 NOTE — Telephone Encounter (Signed)
, °

## 2014-02-05 NOTE — Telephone Encounter (Signed)
Refill Request for Effecor rcvd from [facility]  Refill sent, Rcpt confirmed by pharmacy.

## 2014-02-05 NOTE — Progress Notes (Signed)
ID: Deanna Barry   DOB: [birth year]  MR#: [id number]  HKV#:[id number]  PCP: Jonathon Bellows, MD GYN: Servando Salina SU:  OTHER MD: Bjorn Loser  Chief complaint: Follow up visit for breast cancer  HISTORY OF PRESENT ILLNESS: As per previously documented note:  [name] had screening mammography August 2011, which noted dense breasts.  In addition, calcifications were noted on the left side and she was brought back for additional imaging May 07, 2010.  Dr March Rummage felt these were benign and he saw no change in breast architecture as compared to July 2009, which was the prior mammogram.  Accordingly, a 81-month repeat mammography for followup of the calcifications was scheduled.  This was performed November 23, 2010.  Dr Marcelo Baldy found that the previously noted calcifications were stable but that there was a partially circumscribed mass now noted in the lower inner quadrant of the left breast.  By ultrasound this was lobulated and hypoechoic and measured approximately 8 mm.  Accordingly, the patient was brought back for ultrasound-guided biopsy November 25, 2010, and this showed [phone]) an invasive ductal carcinoma that appeared to be low grade and was associated with abundant extracellular mucin.  The tumor showed no HER-2/neu amplification, with a CIS ratio of 1.26.  It was strongly estrogen and progesterone receptor positive at 96% and 94% respectively.  The proliferation marker was 20%.    With this information, the patient was referred to our multidisciplinary clinic and bilateral breast MRIs were obtained November 30, 2010.  This showed the left breast mass to measure 1.7 cm and also noted a skin lesion or sebaceous epithelial inclusion cyst at the junction of the middle and posterior third of the breast, 4 o'clock position, associated with the skin.  There was an incidentally noted small right pleural effusion and a pulmonary nodule in the left upper lobe, which was seen on CT scan from July  2006 (measuring 8 mm at that time).    Patient underwent left mastectomy and sentinel lymph node sampling with simple right mastectomy in March 2012 for a T1CN0, grade 1, invasive ductal carcinoma. Tumor was strongly ER and PR positive, HER-2/neu equivocal, with an Oncotype DX which quoted her a risk of recurrence at 10 years of 9% if she takes 5 years of tamoxifen. Patient was started on tamoxifen in May of 2012, with good tolerance.  Patient is known to be BRCA1 and BRCA2 negative.  INTERVAL HISTORY: Deanna Barry returns today for followup visit for her breast cancer. Since the time of last visit she was seen by urology for recurrent urinary tract infections and  was treated for urinary tract infections and since then she has no problem she's been doing very well without any dysuria or increase in frequency of urination. At present she complains of left knee pain and she is seeing orthopedics for partial knee replacement.  She is tolerating tamoxifen quite well without significant hot flashes or vaginal dryness. She is up-to-date with annual pelvic exam and Pap smears she thinks her last Pap smear/ pelvic exam was in  December of last year She complains of migraines and she takes Effexor for her migraines She denies any fever, shortness of breath, chest pain, blood in stool, blood in the urine, she's not able to do enough exercise because of left knee pain. Her appetite is good.  REVIEW OF SYSTEMS: A 10 point review of systems is been assessed and pertinent symptoms were mentioned in interval history   PAST M EDICAL HISTORY: Past Medical  History  Diagnosis Date  . Anemia     in the past, bruises easily  . Arthritis   . GERD (gastroesophageal reflux disease)   . Hypothyroidism   . Headache(784.0)     hx. migraines  . Sleep apnea     no CPAP; occ. wakes up gasping/choking  . Hypertension     under control; has been on med. x 1-2 yrs.  . TMJ syndrome   . Abrasion of right knee 10/2011    fell  on ice  . Complication of anesthesia 12/28/2010    woke up combative  . Breast cancer, left breast 01/05/2012    PAST SURGICAL HISTORY: Past Surgical History  Procedure Laterality Date  . Foot surgery      BOTH  . Forearm surgery      RIGHT  . Uvulopalatopharyngoplasty    . Dilation and curettage of uterus  07/16/2004    hysteroscopy  . Thyroid lobectomy  04/14/2004    left; isthmusectomy  . Elbow arthroscopy  12/10/2003    left  . Mastectomy  12/28/2010    bilat.  . Breast reconstruction  11/17/2011    Procedure: BREAST RECONSTRUCTION;  Surgeon: Theodoro Kos, DO;  Location: [place];  Service: Plastics;  Laterality: Bilateral;  BILATERAL BREAST RECONSTRUCTION WITH EXCISON AND REPAIR EXCESS SKIN ON LATERAL ASPECT AND AXILLARY AREAST WITH SUCTION ASSISTED LIPECTOMY    FAMILY HISTORY Family History  Problem Relation Age of Onset  . Cancer Mother   . Cancer Maternal Grandmother     COLON  . Cancer Paternal Grandmother   . Cancer Paternal Grandfather    GYNECOLOGIC HISTORY:  The patient is GX, P0.  She had her first period at age 77.  Most recent period was November 25, 2010.    SOCIAL HISTORY:  Briannia works as a Quarry manager.  She lives with her partner, [name].       ADVANCED DIRECTIVES: Not in place  HEALTH MAINTENANCE: History  Substance Use Topics  . Smoking status: Never Smoker   . Smokeless tobacco: Never Used  . Alcohol Use: Yes     Comment: socially     Colonoscopy: 2013/ [facility]  PAP:  Bone density:  Lipid panel:  Allergies  Allergen Reactions  . Other Other (See Comments)    SCENTED LOTIONS - MIGRAINES  . Penicillins Cross Reactors Itching and Rash    Current Outpatient Prescriptions  Medication Sig Dispense Refill  . atenolol (TENORMIN) 25 MG tablet Take 1 tablet (25 mg total) by mouth daily. AM  30 tablet  1  . atorvastatin (LIPITOR) 40 MG tablet Take 40 mg by mouth daily.      Marland Kitchen levothyroxine (SYNTHROID, LEVOTHROID) 25 MCG  tablet Take 1 tablet (25 mcg total) by mouth daily. AM  30 tablet  2  . meloxicam (MOBIC) 15 MG tablet Take 1 tablet (15 mg total) by mouth daily.  30 tablet  1  . omeprazole (PRILOSEC) 20 MG capsule Take 20 mg by mouth daily. AM      . tamoxifen (NOLVADEX) 20 MG tablet Take 1 tablet (20 mg total) by mouth daily. AM  90 tablet  12  . venlafaxine (EFFEXOR) 75 MG tablet Take 1 tablet (75 mg total) by mouth daily. AM  60 tablet  11   No current facility-administered medications for this visit.    OBJECTIVE: Exam today was deferred Filed Vitals:   02/05/14 0847  BP: 128/87  Pulse: 89  Temp: 98.4 F (36.9  C)  Resp: 18     Body mass index is 36.87 kg/(m^2).     PHYSICAL EXAM: HEENT PERRLA, conjunctiva no pallor, no JVD, no thyromegaly,no lymphadenopathy Lymph node examination: no lymphadenopathy appreciated Respiratory system clear to auscultation ,no wheezes CVS: S1 and S2 normal intensity, no murmurs Abdomen soft, no hepatosplenomegaly Extremities no edema, no cyanosis ,no clubbing NEURO: no focal deficits Skin intact Breast examination: patient has bilateral mastectomy scars noted, no nodules appreciated on the chest wall and no bilateral axillary lymphadenopathy    Urinalysis has a negative leukocyte esterase and negative WBC    Component Value Date/Time   LABSPEC 1.030 07/30/2013 1351   GLUCOSEU Negative 07/30/2013 1351   UROBILINOGEN 0.2 07/30/2013 1351     Lab Results  Component Value Date   WBC 5.7 02/05/2014   NEUTROABS 2.5 02/05/2014   HGB 14.2 02/05/2014   HCT 41.3 02/05/2014   MCV 84.4 02/05/2014   PLT 227 02/05/2014      Chemistry      Component Value Date/Time   NA 144 02/05/2014 0835   NA 138 12/30/2011 0950   K 4.4 02/05/2014 0835   K 4.5 12/30/2011 0950   CL 106 12/27/2012 0728   CL 103 12/30/2011 0950   CO2 23 02/05/2014 0835   CO2 28 12/30/2011 0950   BUN 16.3 02/05/2014 0835   BUN 23 12/30/2011 0950   CREATININE 0.8 02/05/2014 0835   CREATININE 0.79 12/30/2011 0950       Component Value Date/Time   CALCIUM 9.4 02/05/2014 0835   CALCIUM 9.1 12/30/2011 0950   ALKPHOS 68 02/05/2014 0835   ALKPHOS 63 12/30/2011 0950   AST 22 02/05/2014 0835   AST 24 12/30/2011 0950   ALT 24 02/05/2014 0835   ALT 29 12/30/2011 0950   BILITOT 0.47 02/05/2014 0835   BILITOT 0.5 12/30/2011 0950       Lab Results  Component Value Date   LABCA2 21 12/27/2012       ASSESSMENT: 53 y.o.  BRCA negative [place] woman   (1)  status post left mastectomy and sentinel lymph node sampling with simple right mastectomy, March 2012, for a T1c N0 grade1 invasive ductal carcinoma, strongly estrogen and progesterone receptor positive, HER-2 was 1.26 and 2.04 ("equivocal" per 2012 guidelines) on separate determinations  (2) Oncotype DX which quotes her a risk of recurrence at 10 years of 9% if her only systemic therapy is 5 years of tamoxifen. (HER-2 was also negative per Oncotype)    (3)  started on tamoxifen since May 2012   PLAN: Shevelle is tolerating tamoxifen very well. Continue tamoxifen for at least 5 years perhaps if she tolerates perhaps continue for 10 years. She is up-to-date with her annual pelvic exam and Pap smears. I have reviewed her CBC and CMP in those are within normal range. No clinical evidence of any cancer recurrence noted She is scheduled to see orthopedics for her left knee pain . Niesha says  she is much relieved from recurrent UTIs. And she feels much better . Next followup visit in one year with CBC and differential and CMP on the day of next visit  Stephanie is in agreement with this plan. She knows to call for any problems that may develop before her next visit here  Wilmon Arms , , M.D. Medical oncology    02/05/2014

## 2014-02-18 ENCOUNTER — Other Ambulatory Visit: Payer: Self-pay | Admitting: *Deleted

## 2014-02-18 DIAGNOSIS — C50912 Malignant neoplasm of unspecified site of left female breast: Secondary | ICD-10-CM

## 2014-02-18 MED ORDER — TAMOXIFEN CITRATE 20 MG PO TABS: 20.0000 mg | ORAL_TABLET | Freq: Every day | ORAL | Status: DC

## 2014-02-21 ENCOUNTER — Ambulatory Visit (INDEPENDENT_AMBULATORY_CARE_PROVIDER_SITE_OTHER): Payer: 59 | Admitting: *Deleted

## 2014-02-21 DIAGNOSIS — M722 Plantar fascial fibromatosis: Secondary | ICD-10-CM

## 2014-02-21 NOTE — Progress Notes (Signed)
Dispensed patient's orthotics with oral and written instructions for wearing. Patient will follow up with Dr. Sikora in 6 weeks for an orthotic check.  

## 2014-02-21 NOTE — Patient Instructions (Signed)

## 2014-03-19 ENCOUNTER — Ambulatory Visit: Payer: 59

## 2014-05-27 ENCOUNTER — Telehealth: Payer: Self-pay | Admitting: *Deleted

## 2014-05-27 NOTE — Telephone Encounter (Signed)
Pt request refill Meloxicam.  Dr. Erasmo Downer last dictation ordered pt to follow up in 3 weeks.  I informed pt she would need to schedule an appt prior to refills.

## 2014-05-27 NOTE — Telephone Encounter (Signed)
Called and left a voice mail to call back and schedule an appointment.

## 2014-05-28 ENCOUNTER — Ambulatory Visit (INDEPENDENT_AMBULATORY_CARE_PROVIDER_SITE_OTHER): Payer: 59

## 2014-05-28 VITALS — BP 141/90 | HR 84 | Resp 12

## 2014-05-28 DIAGNOSIS — M79609 Pain in unspecified limb: Secondary | ICD-10-CM

## 2014-05-28 DIAGNOSIS — M722 Plantar fascial fibromatosis: Secondary | ICD-10-CM

## 2014-05-28 DIAGNOSIS — M79673 Pain in unspecified foot: Secondary | ICD-10-CM

## 2014-05-28 MED ORDER — MELOXICAM 15 MG PO TABS: 15.0000 mg | ORAL_TABLET | Freq: Every day | ORAL | Status: DC

## 2014-05-28 NOTE — Patient Instructions (Signed)

## 2014-05-28 NOTE — Progress Notes (Signed)
   Subjective:    Patient ID: Deanna Barry, female    DOB: [birth year], 53 y.o.   MRN: [id number]  HPI ''B/L HEEL ARE VERY PAINFUL.''   Review of Systems no new findings or systemic changes noted     Objective:   Physical Exam Lower extremity objective findings unchanged vascular status intact pedal pulses palpable patient wearing orthotics in general her feet are improved although still having pain she gets worse during certain seasons when she is up on her feet anymore standing and walking activities. Patient does need refills for her [facility] for meloxicam which has been helping. Advised that there are options for more aggressive treatment including steroid injections which patient wishes to avoid this time also briefly discussed the possibility of the shockwave therapy if symptoms persist or fail to improve. Patient will maintain orthotics and maintain Eloxatin. Also will use topical pain formula cream such as Aspercreme or bio freeze.       Assessment & Plan:  Assessment plantar fasciitis/heel spur syndrome although improved not completely resolved has had flareups treat flareups as needed with NSAID therapies ice and anti-inflammatory creams reappointed future and as-needed basis for followup  Harriet Masson DPM

## 2014-12-02 ENCOUNTER — Other Ambulatory Visit: Payer: Self-pay | Admitting: *Deleted

## 2014-12-02 MED ORDER — MELOXICAM 15 MG PO TABS: 15.0000 mg | ORAL_TABLET | Freq: Every day | ORAL | Status: DC

## 2014-12-02 NOTE — Telephone Encounter (Signed)
Refill request for Meloxicam 15mg .  Dr. Noralyn Pick only one refill.

## 2015-02-03 ENCOUNTER — Other Ambulatory Visit: Payer: Self-pay | Admitting: *Deleted

## 2015-02-03 DIAGNOSIS — R232 Flushing: Secondary | ICD-10-CM

## 2015-02-03 MED ORDER — VENLAFAXINE HCL 75 MG PO TABS: 75.0000 mg | ORAL_TABLET | Freq: Every day | ORAL | Status: DC

## 2015-02-09 ENCOUNTER — Other Ambulatory Visit: Payer: Self-pay | Admitting: *Deleted

## 2015-02-09 DIAGNOSIS — C50912 Malignant neoplasm of unspecified site of left female breast: Secondary | ICD-10-CM

## 2015-02-10 ENCOUNTER — Ambulatory Visit (HOSPITAL_BASED_OUTPATIENT_CLINIC_OR_DEPARTMENT_OTHER): Payer: 59 | Admitting: Oncology

## 2015-02-10 ENCOUNTER — Other Ambulatory Visit (HOSPITAL_BASED_OUTPATIENT_CLINIC_OR_DEPARTMENT_OTHER): Payer: 59

## 2015-02-10 ENCOUNTER — Telehealth: Payer: Self-pay | Admitting: Oncology

## 2015-02-10 VITALS — BP 129/90 | HR 96 | Temp 98.5°F | Resp 18 | Ht 64.0 in

## 2015-02-10 DIAGNOSIS — Z9013 Acquired absence of bilateral breasts and nipples: Secondary | ICD-10-CM | POA: Diagnosis not present

## 2015-02-10 DIAGNOSIS — C50912 Malignant neoplasm of unspecified site of left female breast: Secondary | ICD-10-CM | POA: Diagnosis not present

## 2015-02-10 DIAGNOSIS — Z170 Estrogen receptor positive status [ER+]: Secondary | ICD-10-CM

## 2015-02-10 DIAGNOSIS — Z79810 Long term (current) use of selective estrogen receptor modulators (SERMs): Secondary | ICD-10-CM

## 2015-02-10 LAB — CBC WITH DIFFERENTIAL/PLATELET
BASO%: 0.6 % (ref 0.0–2.0)
Basophils Absolute: 0 10*3/uL (ref 0.0–0.1)
EOS%: 2.7 % (ref 0.0–7.0)
Eosinophils Absolute: 0.2 10*3/uL (ref 0.0–0.5)
HCT: 44.3 % (ref 34.8–46.6)
HGB: 15 g/dL (ref 11.6–15.9)
LYMPH%: 35.1 % (ref 14.0–49.7)
MCH: 30.1 pg (ref 25.1–34.0)
MCHC: 33.9 g/dL (ref 31.5–36.0)
MCV: 88.8 fL (ref 79.5–101.0)
MONO#: 0.7 10*3/uL (ref 0.1–0.9)
MONO%: 10.7 % (ref 0.0–14.0)
NEUT#: 3.2 10*3/uL (ref 1.5–6.5)
NEUT%: 50.9 % (ref 38.4–76.8)
PLATELETS: 186 10*3/uL (ref 145–400)
RBC: 4.99 10*6/uL (ref 3.70–5.45)
RDW: 12.9 % (ref 11.2–14.5)
WBC: 6.2 10*3/uL (ref 3.9–10.3)
lymph#: 2.2 10*3/uL (ref 0.9–3.3)

## 2015-02-10 LAB — COMPREHENSIVE METABOLIC PANEL (CC13)
ALBUMIN: 3.7 g/dL (ref 3.5–5.0)
ALK PHOS: 66 U/L (ref 40–150)
ALT: 51 U/L (ref 0–55)
AST: 38 U/L — AB (ref 5–34)
Anion Gap: 9 mEq/L (ref 3–11)
BUN: 17.1 mg/dL (ref 7.0–26.0)
CHLORIDE: 106 meq/L (ref 98–109)
CO2: 26 mEq/L (ref 22–29)
CREATININE: 0.8 mg/dL (ref 0.6–1.1)
Calcium: 9.5 mg/dL (ref 8.4–10.4)
EGFR: 80 mL/min/{1.73_m2} — ABNORMAL LOW (ref >90)
Glucose: 119 mg/dl (ref 70–140)
Potassium: 4.5 mEq/L (ref 3.5–5.1)
Sodium: 141 mEq/L (ref 136–145)
Total Bilirubin: 0.44 mg/dL (ref 0.20–1.20)
Total Protein: 6.8 g/dL (ref 6.4–8.3)

## 2015-02-10 MED ORDER — TAMOXIFEN CITRATE 20 MG PO TABS: 20.0000 mg | ORAL_TABLET | Freq: Every day | ORAL | Status: DC

## 2015-02-10 NOTE — Telephone Encounter (Signed)
Appointments made and avs printed for patient °

## 2015-02-10 NOTE — Progress Notes (Signed)
ID: Deanna Barry   DOB: 1961/04/24  MR#: [id number]  IAX#:[id number]  PCP: Jonathon Bellows, MD GYN: Servando Salina SU:  OTHER MD: Bjorn Loser. Arlys John  CHIEF COMPLAINT: Estrogen receptor positive breast cancer  CURRENT TREATMENT: Tamoxifen  HISTORY OF PRESENT ILLNESS: As per previously documented note:  Deanna Barry had screening mammography August 2011, which noted dense breasts.  In addition, calcifications were noted on the left side and she was brought back for additional imaging May 07, 2010.  Dr March Rummage felt these were benign and he saw no change in breast architecture as compared to July 2009, which was the prior mammogram.  Accordingly, a 34-month repeat mammography for followup of the calcifications was scheduled.  This was performed November 23, 2010.  Dr Marcelo Baldy found that the previously noted calcifications were stable but that there was a partially circumscribed mass now noted in the lower inner quadrant of the left breast.  By ultrasound this was lobulated and hypoechoic and measured approximately 8 mm.  Accordingly, the patient was brought back for ultrasound-guided biopsy November 25, 2010, and this showed [phone]) an invasive ductal carcinoma that appeared to be low grade and was associated with abundant extracellular mucin.  The tumor showed no HER-2/neu amplification, with a CIS ratio of 1.26.  It was strongly estrogen and progesterone receptor positive at 96% and 94% respectively.  The proliferation marker was 20%.    With this information, the patient was referred to our multidisciplinary clinic and bilateral breast MRIs were obtained November 30, 2010.  This showed the left breast mass to measure 1.7 cm and also noted a skin lesion or sebaceous epithelial inclusion cyst at the junction of the middle and posterior third of the breast, 4 o'clock position, associated with the skin.  There was an incidentally noted small right pleural effusion and a pulmonary  nodule in the left upper lobe, which was seen on CT scan from July 2006 (measuring 8 mm at that time).    Patient underwent left mastectomy and sentinel lymph node sampling with simple right mastectomy in March 2012 for a T1CN0, grade 1, invasive ductal carcinoma. Tumor was strongly ER and PR positive, HER-2/neu equivocal, with an Oncotype DX which quoted her a risk of recurrence at 10 years of 9% if she takes 5 years of tamoxifen. Patient was started on tamoxifen in May of 2012, with good tolerance. Patient is known to be BRCA1 and BRCA2 negative.  Her subsequent history is as detailed below  INTERVAL HISTORY: Deanna Barry returns today for followup visit for her breast cancer. The interval history is generally unremarkable. She did have left knee surgery under [facility]. She is still recovering somewhat from that. She had a dark spot on her upper anterior gum which turned out to be a foreign body and not a melanoma as she feared. Otherwise she continues on tamoxifen. She tolerates that well. She has occasional hot flashes which are not a major concern to her.  REVIEW OF SYSTEMS: A detailed review of systems today was otherwise entirely stable   PAST M EDICAL HISTORY: Past Medical History  Diagnosis Date  . Anemia     in the past, bruises easily  . Arthritis   . GERD (gastroesophageal reflux disease)   . Hypothyroidism   . Headache(784.0)     hx. migraines  . Sleep apnea     no CPAP; occ. wakes up gasping/choking  . Hypertension     under control; has been on med.  x 1-2 yrs.  . TMJ syndrome   . Abrasion of right knee 10/2011    fell on ice  . Complication of anesthesia 12/28/2010    woke up combative  . Breast cancer, left breast 01/05/2012    PAST SURGICAL HISTORY: Past Surgical History  Procedure Laterality Date  . Foot surgery      BOTH  . Forearm surgery      RIGHT  . Uvulopalatopharyngoplasty    . Dilation and curettage of uterus  07/16/2004    hysteroscopy  . Thyroid  lobectomy  04/14/2004    left; isthmusectomy  . Elbow arthroscopy  12/10/2003    left  . Mastectomy  12/28/2010    bilat.  . Breast reconstruction  11/17/2011    Procedure: BREAST RECONSTRUCTION;  Surgeon: Theodoro Kos, DO;  Location: [facility];  Service: Plastics;  Laterality: Bilateral;  BILATERAL BREAST RECONSTRUCTION WITH EXCISON AND REPAIR EXCESS SKIN ON LATERAL ASPECT AND AXILLARY AREAST WITH SUCTION ASSISTED LIPECTOMY    FAMILY HISTORY Family History  Problem Relation Age of Onset  . Cancer Mother   . Cancer Maternal Grandmother     COLON  . Cancer Paternal Grandmother   . Cancer Paternal Grandfather    GYNECOLOGIC HISTORY:  The patient is GX, P0.  She had her first period at age 33.  Most recent period was November 25, 2010.    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  She lives with her partner, Deanna Barry.  Deanna Barry is thinking of retiring perhaps in late 2017. If that happens, she will probably moved to [place] at that time.    ADVANCED DIRECTIVES: Not in place  HEALTH MAINTENANCE: History  Substance Use Topics  . Smoking status: Never Smoker   . Smokeless tobacco: Never Used  . Alcohol Use: Yes     Comment: socially     Colonoscopy: 2013/ [facility]  PAP:  Bone density:  Lipid panel:  Allergies  Allergen Reactions  . Other Other (See Comments)    SCENTED LOTIONS - MIGRAINES  . Penicillins Cross Reactors Itching and Rash    Current Outpatient Prescriptions  Medication Sig Dispense Refill  . atenolol (TENORMIN) 25 MG tablet Take 1 tablet (25 mg total) by mouth daily. AM 30 tablet 1  . atorvastatin (LIPITOR) 40 MG tablet Take 40 mg by mouth daily.    Marland Kitchen levothyroxine (SYNTHROID, LEVOTHROID) 25 MCG tablet Take 1 tablet (25 mcg total) by mouth daily. AM 30 tablet 2  . meloxicam (MOBIC) 15 MG tablet Take 1 tablet (15 mg total) by mouth daily. 30 tablet 0  . omeprazole (PRILOSEC) 20 MG capsule Take 20 mg by mouth daily. AM    . tamoxifen  (NOLVADEX) 20 MG tablet Take 1 tablet (20 mg total) by mouth daily. AM 90 tablet 3  . venlafaxine (EFFEXOR) 75 MG tablet Take 1 tablet (75 mg total) by mouth daily. AM 60 tablet 5   No current facility-administered medications for this visit.    OBJECTIVE: Middle-aged white woman who appears younger than stated age 54 Vitals:   02/10/15 0844  BP: 129/90  Pulse: 96  Temp: 98.5 F (36.9 C)  Resp: 18     There is no weight on file to calculate BMI.     Sclerae unicteric, pupils equal and reactive Oropharynx clear and moist-- no thrush or other lesions No cervical or supraclavicular adenopathy Lungs no rales or rhonchi Heart regular rate and rhythm Abd soft, obese, nontender, positive bowel  sounds MSK no focal spinal tenderness, no upper extremity lymphedema Neuro: nonfocal, well oriented, positive affect Breasts: Status post bilateral mastectomies. There is no evidence of chest wall recurrence. Both axillae are benign.   LABS    Component Value Date/Time   LABSPEC 1.030 07/30/2013 1351   PHURINE 6.0 07/30/2013 1351   GLUCOSEU Negative 07/30/2013 1351   HGBUR Moderate 07/30/2013 1351   BILIRUBINUR Negative 07/30/2013 1351   KETONESUR Negative 07/30/2013 1351   PROTEINUR < 30 07/30/2013 1351   UROBILINOGEN 0.2 07/30/2013 1351   NITRITE Negative 07/30/2013 1351   LEUKOCYTESUR Negative 07/30/2013 1351     Lab Results  Component Value Date   WBC 6.2 02/10/2015   NEUTROABS 3.2 02/10/2015   HGB 15.0 02/10/2015   HCT 44.3 02/10/2015   MCV 88.8 02/10/2015   PLT 186 02/10/2015      Chemistry      Component Value Date/Time   NA 141 02/10/2015 0829   NA 138 12/30/2011 0950   K 4.5 02/10/2015 0829   K 4.5 12/30/2011 0950   CL 106 12/27/2012 0728   CL 103 12/30/2011 0950   CO2 26 02/10/2015 0829   CO2 28 12/30/2011 0950   BUN 17.1 02/10/2015 0829   BUN 23 12/30/2011 0950   CREATININE 0.8 02/10/2015 0829   CREATININE 0.79 12/30/2011 0950      Component Value  Date/Time   CALCIUM 9.5 02/10/2015 0829   CALCIUM 9.1 12/30/2011 0950   ALKPHOS 66 02/10/2015 0829   ALKPHOS 63 12/30/2011 0950   AST 38* 02/10/2015 0829   AST 24 12/30/2011 0950   ALT 51 02/10/2015 0829   ALT 29 12/30/2011 0950   BILITOT 0.44 02/10/2015 0829   BILITOT 0.5 12/30/2011 0950       Lab Results  Component Value Date   LABCA2 21 12/27/2012       ASSESSMENT: 54 y.o.  BRCA negative [place] woman   (1)  status post left mastectomy and sentinel lymph node sampling with simple right mastectomy, March 2012, for a T1c N0 grade1 invasive ductal carcinoma, strongly estrogen and progesterone receptor positive, HER-2 was 1.26 and 2.04 ("equivocal" per 2012 guidelines) on separate determinations  (2) Oncotype DX which quotes her a risk of recurrence at 10 years of 9% if her only systemic therapy is 5 years of tamoxifen. (HER-2 was also negative per Oncotype)    (3)  started on tamoxifen May 2012, to be continued for total of 10 years  PLAN: Kariann is a little over 4 years out from her definite surgery for early stage breast cancer with no evidence of disease recurrence. This is very favorable.  She is tolerating tamoxifen well and the plan will be to continue that for a total of 10 years.   She will see me again a year from now. That might be her last visit here if she does move to [place] as she is thinking sometime late next year. She knows to call for any problems that may develop before then.  Chauncey Cruel, MD  Medical oncology    02/10/2015

## 2015-04-20 ENCOUNTER — Encounter: Payer: Self-pay | Admitting: Genetic Counselor

## 2015-05-18 ENCOUNTER — Telehealth: Payer: Self-pay | Admitting: Genetic Counselor

## 2015-05-18 NOTE — Telephone Encounter (Signed)
f/u genetic appt-patient called to schedule f/u genetic appt for 09/21 @ 2 w/Karen Florene Glen.

## 2015-06-24 ENCOUNTER — Ambulatory Visit (HOSPITAL_BASED_OUTPATIENT_CLINIC_OR_DEPARTMENT_OTHER): Payer: 59 | Admitting: Genetic Counselor

## 2015-06-24 ENCOUNTER — Encounter: Payer: Self-pay | Admitting: Genetic Counselor

## 2015-06-24 ENCOUNTER — Other Ambulatory Visit: Payer: 59

## 2015-06-24 DIAGNOSIS — Z800 Family history of malignant neoplasm of digestive organs: Secondary | ICD-10-CM

## 2015-06-24 DIAGNOSIS — C50112 Malignant neoplasm of central portion of left female breast: Secondary | ICD-10-CM

## 2015-06-24 DIAGNOSIS — Z1379 Encounter for other screening for genetic and chromosomal anomalies: Secondary | ICD-10-CM | POA: Insufficient documentation

## 2015-06-24 DIAGNOSIS — Z807 Family history of other malignant neoplasms of lymphoid, hematopoietic and related tissues: Secondary | ICD-10-CM

## 2015-06-24 DIAGNOSIS — Z8051 Family history of malignant neoplasm of kidney: Secondary | ICD-10-CM | POA: Diagnosis not present

## 2015-06-24 DIAGNOSIS — C50912 Malignant neoplasm of unspecified site of left female breast: Secondary | ICD-10-CM

## 2015-06-24 DIAGNOSIS — Z803 Family history of malignant neoplasm of breast: Secondary | ICD-10-CM

## 2015-06-24 DIAGNOSIS — Z85850 Personal history of malignant neoplasm of thyroid: Secondary | ICD-10-CM

## 2015-06-24 DIAGNOSIS — Z8041 Family history of malignant neoplasm of ovary: Secondary | ICD-10-CM | POA: Insufficient documentation

## 2015-06-24 NOTE — Progress Notes (Signed)
REFERRING PROVIDER: Maurice Small, MD [place], [place] [address]   Deanna Del, MD  PRIMARY PROVIDER:  Jonathon Bellows, MD  PRIMARY REASON FOR VISIT:  1. Breast cancer, left breast   2. Family history of breast cancer   3. Family history of colon cancer   4. Family history of ovarian cancer   5. Family history of kidney cancer      HISTORY OF PRESENT ILLNESS:   Deanna Barry, a 54 y.o. female, was seen for a [facility] cancer genetics consultation at the request of Dr. Jana Barry due to a personal and family history of cancer.  Deanna Barry presents to clinic today to discuss the possibility of a hereditary predisposition to cancer, genetic testing, and to further clarify her future cancer risks, as well as potential cancer risks for family members.   In 2005, at the age of 89, Deanna Barry was diagnosed with a follicular nodule of the left thyroid. This was treated with lobectomy. In 2011, at the age of 56, Deanna Barry was diagnosed with invasive ductal carcinoma.  The tumor was ER+/PR+/Her2-.  This was treated with double mastectomy and tamoxifen.  At this time she was tested for BRCA mutations which was negative.   CANCER HISTORY:   No history exists.     HORMONAL RISK FACTORS:  Menarche was at age 84-13.  First live birth at age N/A.  OCP use for approximately 0 years.  Ovaries intact: yes.  Hysterectomy: no.  Menopausal status: postmenopausal.  HRT use: 0 years. Colonoscopy: yes; between 1-4 polyps, and is on a 5 year schedule. Mammogram within the last year: double mastectomy - no screening. Number of breast biopsies: 1. Up to date with pelvic exams:  yes. Any excessive radiation exposure in the past:  no  Past Medical History  Diagnosis Date  . Anemia     in the past, bruises easily  . Arthritis   . GERD (gastroesophageal reflux disease)   . Hypothyroidism   . Headache(784.0)     hx. migraines  . Sleep apnea     no CPAP; occ. wakes  up gasping/choking  . Hypertension     under control; has been on med. x 1-2 yrs.  . TMJ syndrome   . Abrasion of right knee 10/2011    fell on ice  . Complication of anesthesia 12/28/2010    woke up combative  . Breast cancer, left breast 01/05/2012  . Family history of breast cancer   . Family history of colon cancer   . Family history of ovarian cancer   . Family history of kidney cancer     Past Surgical History  Procedure Laterality Date  . Foot surgery      BOTH  . Forearm surgery      RIGHT  . Uvulopalatopharyngoplasty    . Dilation and curettage of uterus  07/16/2004    hysteroscopy  . Thyroid lobectomy  04/14/2004    left; isthmusectomy  . Elbow arthroscopy  12/10/2003    left  . Mastectomy  12/28/2010    bilat.  . Breast reconstruction  11/17/2011    Procedure: BREAST RECONSTRUCTION;  Surgeon: Theodoro Kos, DO;  Location: [facility];  Service: Plastics;  Laterality: Bilateral;  BILATERAL BREAST RECONSTRUCTION WITH EXCISON AND REPAIR EXCESS SKIN ON LATERAL ASPECT AND AXILLARY AREAST WITH SUCTION ASSISTED LIPECTOMY    Social History   Social History  . Marital Status: Single    Spouse Name:  N/A  . Number of Children: 0  . Years of Education: N/A   Social History Main Topics  . Smoking status: Former Smoker -- 1.00 packs/day for 12 years    Types: Cigarettes  . Smokeless tobacco: Never Used  . Alcohol Use: Yes     Comment: socially  . Drug Use: No  . Sexual Activity: Yes   Other Topics Concern  . None   Social History Narrative     FAMILY HISTORY:  We obtained a detailed, 4-generation family history.  Significant diagnoses are listed below: Family History  Problem Relation Age of Onset  . Breast cancer Mother 19  . Kidney cancer Mother 71  . Colon cancer Maternal Grandmother     dx in her 19s  . Breast cancer Paternal Grandmother     dx under 67s  . Ovarian cancer Paternal Grandmother   . Lung cancer Paternal Grandfather     smoker   . Hodgkin's lymphoma Paternal Aunt   . Prostate cancer Maternal Grandfather   . Cancer Cousin     Maternal first cousin with cancer NOS   The patient does not have any children. She has three sisters, none of whom have cancer.  Her mother was diagnosed with breast cancer at age 97 and kidney cancer at 67.  She had two brothers, both are alive, but one has a son who has a cancer NOS.  [name] maternal grandmother was diagnosed with colon cancer in her 82s and her grandfather died from prostate cancer in his 43s.  Her father died at 77. He had one sister who had Hodgkin's lymphoma and died.  Her paternal grandmother was diagnosed with breast cancer under the age of 6 and ovarian cancer in her 29s.  Patient's maternal ancestors are of [place], [place] and [place] descent, and paternal ancestors are of [place], [place] and [place] descent. There is no reported Ashkenazi Jewish ancestry. There is no known consanguinity.  GENETIC COUNSELING ASSESSMENT: [name] is a 54 y.o. female with a personal and family history of cancer which somewhat suggestive of a hereditary cancer syndrome and predisposition to cancer. We, therefore, discussed and recommended the following at today's visit.   DISCUSSION: Deanna Barry had complete testing of BRCA in 2011.  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  We also discussed that based on the family history of ovarian cancer in her paternal grandmother and early breast cancer as well, she is at increased risk for other hereditary cancer genes.  We reviewed the characteristics, features and inheritance patterns of hereditary cancer syndromes. We also discussed genetic testing, including the appropriate family members to test, the process of testing, insurance coverage and turn-around-time for results. We discussed the implications of a negative,  positive and/or variant of uncertain significant result. We recommended Deanna Barry pursue genetic testing for the Breast/[place] cancer gene panel. The Breast/Ovarian gene panel offered by [facility] includes sequencing and rearrangement analysis for the following 20 genes:  ATM, BARD1, BRCA1, BRCA2, BRIP1, CDH1, CHEK2, EPCAM, FANCC, MLH1, MSH2, MSH6, NBN, PALB2, PMS2, PTEN, RAD51C, RAD51D, TP53, and XRCC2.     Based on Ms. Shan's personal and family history of cancer, she meets medical criteria for genetic testing. Despite that she meets criteria, she may still have an out of pocket cost. We discussed that if her out of pocket cost for testing is over $100, the  laboratory will call and confirm whether she wants to proceed with testing.  If the out of pocket cost of testing is less than $100 she will be billed by the genetic testing laboratory.   In order to estimate her chance of having a PTEN mutation, we used statistical models (PTEN calculator) and laboratory data that take into account her personal medical history, family history and ancestry.  Because each model is different, there can be a lot of variability in the risks they give.  Therefore, these numbers must be considered a rough range and not a precise risk of having a PTEN mutation.  These models estimate that she has approximately a 1-6% chance of having a mutation. Based on this assessment of her family and personal history, genetic testing is recommended.  PLAN: After considering the risks, benefits, and limitations, Deanna Barry  provided informed consent to pursue genetic testing and the blood sample was sent to [facility] for analysis of the Breast/Ovarian cancer gene panel. Results should be available within approximately 2-3 weeks' time, at which point they will be disclosed by telephone to Deanna Barry, as will any additional recommendations warranted by these results. Deanna Barry will receive a summary of her genetic counseling  visit and a copy of her results once available. This information will also be available in Epic. We encouraged Deanna Barry to remain in contact with cancer genetics annually so that we can continuously update the family history and inform her of any changes in cancer genetics and testing that may be of benefit for her family. Deanna Barry questions were answered to her satisfaction today. Our contact information was provided should additional questions or concerns arise.  Lastly, we encouraged Deanna Barry to remain in contact with cancer genetics annually so that we can continuously update the family history and inform her of any changes in cancer genetics and testing that may be of benefit for this family.   Ms.  Barry questions were answered to her satisfaction today. Our contact information was provided should additional questions or concerns arise. Thank you for the referral and allowing [place] to share in the care of your patient.   Karen P. Florene Glen, [place], [facility] Certified Genetic Counselor [email or web address] phone: [phone]  The patient was seen for a total of 45 minutes in face-to-face genetic counseling.  This patient was discussed with Drs. Magrinat, Lindi Adie and/or Burr Medico who agrees with the above.    _______________________________________________________________________ For Office Staff:  Number of people involved in session: 1 Was an Intern/ student involved with case: no

## 2015-07-02 ENCOUNTER — Telehealth: Payer: Self-pay | Admitting: Cardiovascular Disease

## 2015-07-02 NOTE — Telephone Encounter (Signed)
07/02/2015 Received referral packet from [facility] through interoffice bag from [facility] for upcoming appointment with Dr. Oval Linsey on 07/27/2015.  Records given to [facility]. cbr

## 2015-07-26 NOTE — Progress Notes (Signed)
Cardiology Office Note   Date:  07/27/2015   ID:  COSANDRA PLOUFFE, DOB 01/09/1961, MRN [id number]  PCP:  Jonathon Bellows, MD  Cardiologist:   Sharol Harness, MD   Chief Complaint  Patient presents with  . New Evaluation    patient reports some chest pain/discomfort, shortness of breath with exertion - flights of stairs      History of Present Illness: Deanna Barry is a 54 y.o. female with hypertension, hyperlipidemia, prior tobacco abuse, breast cancer s/p mastectomy currently on Tamoxifen who presents for an evaluation of chest pain.  Deanna Barry saw Dr. Maurice Small on 06/22/15 and reported intermittent left-sided chest pain that's been occurring for the last year. This is occurred at least 6 times. She is unsure what she is doing when it happens, but knows that always happens during the day. It never awakens her from sleep. The chest pain is moderate in severity and does not radiate. She doesn't feel short of breath when happens but does so as though it takes her breath away. She also notes feeling clammy and diaphoretic but denies nausea or vomiting. There is no associated palpitations, lightheadedness or dizziness. The last time it happened she was at work and went to the school nurse who urged her to see a physician about these symptoms.  She denies lower extremity edema, orthopnea or PND. Sometimes when she has a hot flash she feels like her heart is racing but not in other situations.  [name] has been feeling more short of breath with exertion. When climbing stairs she feels like she is more short of breath and she should be. This is been ongoing for at least the last year. She is unsure whether this is due to her heart rate being overweight.  She does not get any formal exercise but walks all day at school. 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  Her diet is fair.  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 After that she would like to move to [place].    Past Medical History  Diagnosis Date  . Anemia     in the past, bruises easily  . Arthritis   . GERD (gastroesophageal reflux disease)   . Hypothyroidism   . Headache(784.0)     hx. migraines  . Sleep apnea     no CPAP; occ. wakes up gasping/choking  . Hypertension     under control; has been on med. x 1-2 yrs.  . TMJ syndrome   . Abrasion of right knee 10/2011    fell on ice  . Complication of anesthesia 12/28/2010    woke up combative  . Breast cancer, left breast ([place]) 01/05/2012  . Family history of breast cancer   . Family history of colon cancer   . Family history of ovarian cancer   . Family history of kidney cancer     Past Surgical History  Procedure Laterality Date  . Foot surgery      BOTH  . Forearm surgery      RIGHT  . Uvulopalatopharyngoplasty    . Dilation and curettage of uterus  07/16/2004    hysteroscopy  . Thyroid lobectomy  04/14/2004    left; isthmusectomy  . Elbow arthroscopy  12/10/2003    left  . Mastectomy  12/28/2010  bilat.  . Breast reconstruction  11/17/2011    Procedure: BREAST RECONSTRUCTION;  Surgeon: Theodoro Kos, DO;  Location: [facility];  Service: Plastics;  Laterality: Bilateral;  BILATERAL BREAST RECONSTRUCTION WITH EXCISON AND REPAIR EXCESS SKIN ON LATERAL ASPECT AND AXILLARY AREAST WITH SUCTION ASSISTED LIPECTOMY     Current Outpatient Prescriptions  Medication Sig Dispense Refill  . atenolol (TENORMIN) 25 MG tablet Take 1 tablet (25 mg total) by mouth daily. AM 30 tablet 1  . atorvastatin (LIPITOR) 40 MG tablet Take 40 mg by mouth daily.    Marland Kitchen levothyroxine (SYNTHROID, LEVOTHROID) 25 MCG tablet Take 1 tablet (25 mcg total) by mouth daily. AM 30 tablet 2  . meloxicam (MOBIC) 15 MG tablet Take 15 mg by mouth daily.  0  . omeprazole (PRILOSEC) 20 MG capsule Take 20 mg by mouth daily. AM    .  tamoxifen (NOLVADEX) 20 MG tablet Take 1 tablet (20 mg total) by mouth daily. AM 90 tablet 3  . venlafaxine (EFFEXOR) 75 MG tablet Take 1 tablet (75 mg total) by mouth daily. AM 60 tablet 5   No current facility-administered medications for this visit.    Allergies:   Other and Penicillins cross reactors    Social History:  The patient  reports that she has quit smoking. Her smoking use included Cigarettes. She has a 12 pack-year smoking history. She has never used smokeless tobacco. She reports that she drinks alcohol. She reports that she does not use illicit drugs.   Family History:  The patient's family history includes Breast cancer in her paternal grandmother; Breast cancer (age of onset: 33) in her mother; Cancer in her cousin; Colon cancer in her maternal grandmother; Hodgkin's lymphoma in her paternal aunt; Kidney cancer (age of onset: 72) in her mother; Lung cancer in her paternal grandfather; Ovarian cancer in her paternal grandmother; Prostate cancer in her maternal grandfather.    ROS:  Please see the history of present illness.   Otherwise, review of systems are positive for none.   All other systems are reviewed and negative.    PHYSICAL EXAM: VS:  BP 126/82 mmHg  Pulse 83  Ht 5\' 4"  (1.626 m)  Wt 100.653 kg (221 lb 14.4 oz)  BMI 38.07 kg/m2  LMP 12/15/2010 , BMI Body mass index is 38.07 kg/(m^2). GENERAL:  Well appearing.  Obese. HEENT:  Pupils equal round and reactive, fundi not visualized, oral mucosa unremarkable NECK:  No jugular venous distention, waveform within normal limits, carotid upstroke brisk and symmetric, no bruits, no thyromegaly LYMPHATICS:  No cervical adenopathy LUNGS:  Clear to auscultation bilaterally HEART:  RRR.  PMI not displaced or sustained,S1 and S2 within normal limits, no S3, no S4, no clicks, no rubs, no murmurs ABD:  Flat, positive bowel sounds normal in frequency in pitch, no bruits, no rebound, no guarding, no midline pulsatile mass, no  hepatomegaly, no splenomegaly EXT:  2 plus pulses throughout, no edema, no cyanosis no clubbing SKIN:  No rashes no nodules NEURO:  Cranial nerves II through XII grossly intact, motor grossly intact throughout PSYCH:  Cognitively intact, oriented to person place and time    EKG:  EKG is ordered today. The ekg ordered today demonstrates sinus rhythm at 83 bpm.     Recent Labs: 02/10/2015: ALT 51; BUN 17.1; Creatinine 0.8; HGB 15.0; Platelets 186; Potassium 4.5; Sodium 141    Lipid Panel No results found for: CHOL, TRIG, HDL, CHOLHDL, VLDL, LDLCALC, LDLDIRECT    Wt Readings  from Last 3 Encounters:  07/27/15 100.653 kg (221 lb 14.4 oz)  02/05/14 97.478 kg (214 lb 14.4 oz)  01/31/14 95.255 kg (210 lb)      ASSESSMENT AND PLAN:  # Chest pain: Symptoms are concerning for ischemia.  We will refer her for [facility] given that she is s/p L knee replacement and does not know if she can walk on the treadmill.  She will also start aspirin 81mg  daily given that her ASCVD 10 year risk on statin is 4%.  Her risk without the statin would almost certainly be >0%.  # Hypertension: BP well-controlled.  Continue atenolol.  # Hyperlipidemia: ASCVD 10-year risk based on her current lipid level is 4%.  Therefore we will not increase her atorvastatin today.    Current medicines are reviewed at length with the patient today.  The patient does not have concerns regarding medicines.  The following changes have been made:  Start aspirin 81 mg daily  Labs/ tests ordered today include:  No orders of the defined types were placed in this encounter.     Disposition:   FU with Eiliyah Reh C. Oval Linsey, MD in 1 year.    Signed, Sharol Harness, MD  07/27/2015 8:33 AM    [facility]

## 2015-07-27 ENCOUNTER — Ambulatory Visit (INDEPENDENT_AMBULATORY_CARE_PROVIDER_SITE_OTHER): Payer: 59 | Admitting: Cardiovascular Disease

## 2015-07-27 ENCOUNTER — Encounter: Payer: Self-pay | Admitting: Cardiovascular Disease

## 2015-07-27 VITALS — BP 126/82 | HR 83 | Ht 64.0 in | Wt 221.9 lb

## 2015-07-27 DIAGNOSIS — R079 Chest pain, unspecified: Secondary | ICD-10-CM | POA: Diagnosis not present

## 2015-07-27 DIAGNOSIS — R072 Precordial pain: Secondary | ICD-10-CM

## 2015-07-27 HISTORY — DX: Chest pain, unspecified: R07.9

## 2015-07-27 NOTE — Patient Instructions (Signed)
Medication Instructions:  START Aspirin 81 mg - take 1 tablet by mouth daily.  Labwork: NONE  Testing/Procedures: Your physician has requested that you have a lexiscan myoview. For further information please visit [email or web address]. Please follow instruction sheet, as given.  Follow-Up: Dr Oval Linsey recommends that you schedule a follow-up appointment in 1 year. You will receive a reminder letter in the mail two months in advance. If you don't receive a letter, please call our office to schedule the follow-up appointment.  If you need a refill on your cardiac medications before your next appointment, please call your pharmacy.

## 2015-07-28 ENCOUNTER — Telehealth (HOSPITAL_COMMUNITY): Payer: Self-pay

## 2015-07-28 NOTE — Telephone Encounter (Signed)
Encounter complete. 

## 2015-07-30 ENCOUNTER — Ambulatory Visit (HOSPITAL_COMMUNITY)
Admission: RE | Admit: 2015-07-30 | Discharge: 2015-07-30 | Disposition: A | Discharge status: Home / Self Care | Payer: 59 | Source: Ambulatory Visit | Attending: Cardiology | Admitting: Cardiology

## 2015-07-30 DIAGNOSIS — G4733 Obstructive sleep apnea (adult) (pediatric): Secondary | ICD-10-CM | POA: Diagnosis not present

## 2015-07-30 DIAGNOSIS — R9439 Abnormal result of other cardiovascular function study: Secondary | ICD-10-CM | POA: Diagnosis not present

## 2015-07-30 DIAGNOSIS — R0602 Shortness of breath: Secondary | ICD-10-CM | POA: Insufficient documentation

## 2015-07-30 DIAGNOSIS — R0609 Other forms of dyspnea: Secondary | ICD-10-CM | POA: Insufficient documentation

## 2015-07-30 DIAGNOSIS — Z6838 Body mass index (BMI) 38.0-38.9, adult: Secondary | ICD-10-CM | POA: Diagnosis not present

## 2015-07-30 DIAGNOSIS — Z87891 Personal history of nicotine dependence: Secondary | ICD-10-CM | POA: Diagnosis not present

## 2015-07-30 DIAGNOSIS — R5383 Other fatigue: Secondary | ICD-10-CM | POA: Insufficient documentation

## 2015-07-30 DIAGNOSIS — I10 Essential (primary) hypertension: Secondary | ICD-10-CM | POA: Insufficient documentation

## 2015-07-30 DIAGNOSIS — E669 Obesity, unspecified: Secondary | ICD-10-CM | POA: Insufficient documentation

## 2015-07-30 DIAGNOSIS — R079 Chest pain, unspecified: Secondary | ICD-10-CM | POA: Insufficient documentation

## 2015-07-30 DIAGNOSIS — R002 Palpitations: Secondary | ICD-10-CM | POA: Insufficient documentation

## 2015-07-30 LAB — MYOCARDIAL PERFUSION IMAGING
CHL CUP NUCLEAR SDS: 1
CHL CUP NUCLEAR SRS: 4
CHL CUP NUCLEAR SSS: 4
LV dias vol: 77 mL
LV sys vol: 28 mL
Peak HR: 82 {beats}/min
Rest HR: 82 {beats}/min
TID: 0.96

## 2015-07-30 MED ORDER — REGADENOSON 0.4 MG/5ML IV SOLN
0.4000 mg | Freq: Once | INTRAVENOUS | Status: AC
  Administered 2015-07-30: 0.4 mg via INTRAVENOUS

## 2015-07-30 MED ORDER — AMINOPHYLLINE 25 MG/ML IV SOLN
75.0000 mg | Freq: Once | INTRAVENOUS | Status: AC
  Administered 2015-07-30: 75 mg via INTRAVENOUS

## 2015-07-30 MED ORDER — TECHNETIUM TC 99M SESTAMIBI GENERIC - CARDIOLITE
31.0000 | Freq: Once | INTRAVENOUS | Status: AC | PRN
  Administered 2015-07-30: 31 via INTRAVENOUS

## 2015-07-30 MED ORDER — TECHNETIUM TC 99M SESTAMIBI GENERIC - CARDIOLITE
10.7000 | Freq: Once | INTRAVENOUS | Status: AC | PRN
  Administered 2015-07-30: 10.7 via INTRAVENOUS

## 2015-08-26 ENCOUNTER — Encounter (HOSPITAL_COMMUNITY): Payer: Self-pay

## 2016-02-03 ENCOUNTER — Other Ambulatory Visit: Payer: Self-pay

## 2016-02-03 DIAGNOSIS — C50212 Malignant neoplasm of upper-inner quadrant of left female breast: Secondary | ICD-10-CM | POA: Insufficient documentation

## 2016-02-04 ENCOUNTER — Other Ambulatory Visit (HOSPITAL_BASED_OUTPATIENT_CLINIC_OR_DEPARTMENT_OTHER): Payer: 59

## 2016-02-04 DIAGNOSIS — C50212 Malignant neoplasm of upper-inner quadrant of left female breast: Secondary | ICD-10-CM | POA: Diagnosis not present

## 2016-02-04 LAB — COMPREHENSIVE METABOLIC PANEL
ALBUMIN: 3.9 g/dL (ref 3.5–5.0)
ALK PHOS: 62 U/L (ref 40–150)
ALT: 61 U/L — ABNORMAL HIGH (ref 0–55)
AST: 56 U/L — AB (ref 5–34)
Anion Gap: 7 mEq/L (ref 3–11)
BUN: 22.2 mg/dL (ref 7.0–26.0)
CO2: 31 meq/L — AB (ref 22–29)
Calcium: 9.8 mg/dL (ref 8.4–10.4)
Chloride: 108 mEq/L (ref 98–109)
Creatinine: 0.9 mg/dL (ref 0.6–1.1)
EGFR: 74 mL/min/{1.73_m2} — AB (ref >90)
GLUCOSE: 80 mg/dL (ref 70–140)
POTASSIUM: 4.2 meq/L (ref 3.5–5.1)
SODIUM: 146 meq/L — AB (ref 136–145)
Total Bilirubin: 0.71 mg/dL (ref 0.20–1.20)
Total Protein: 7.1 g/dL (ref 6.4–8.3)

## 2016-02-04 LAB — CBC WITH DIFFERENTIAL/PLATELET
BASO%: 0.7 % (ref 0.0–2.0)
BASOS ABS: 0 10*3/uL (ref 0.0–0.1)
EOS%: 3.6 % (ref 0.0–7.0)
Eosinophils Absolute: 0.2 10*3/uL (ref 0.0–0.5)
HCT: 43.4 % (ref 34.8–46.6)
HEMOGLOBIN: 14.5 g/dL (ref 11.6–15.9)
LYMPH%: 36.1 % (ref 14.0–49.7)
MCH: 29.5 pg (ref 25.1–34.0)
MCHC: 33.4 g/dL (ref 31.5–36.0)
MCV: 88.3 fL (ref 79.5–101.0)
MONO#: 0.8 10*3/uL (ref 0.1–0.9)
MONO%: 11.3 % (ref 0.0–14.0)
NEUT%: 48.3 % (ref 38.4–76.8)
NEUTROS ABS: 3.3 10*3/uL (ref 1.5–6.5)
Platelets: 201 10*3/uL (ref 145–400)
RBC: 4.91 10*6/uL (ref 3.70–5.45)
RDW: 13.2 % (ref 11.2–14.5)
WBC: 6.9 10*3/uL (ref 3.9–10.3)
lymph#: 2.5 10*3/uL (ref 0.9–3.3)

## 2016-02-11 ENCOUNTER — Ambulatory Visit (HOSPITAL_BASED_OUTPATIENT_CLINIC_OR_DEPARTMENT_OTHER): Payer: 59 | Admitting: Oncology

## 2016-02-11 ENCOUNTER — Telehealth: Payer: Self-pay | Admitting: Oncology

## 2016-02-11 ENCOUNTER — Other Ambulatory Visit: Payer: Self-pay | Admitting: *Deleted

## 2016-02-11 ENCOUNTER — Telehealth: Payer: Self-pay

## 2016-02-11 VITALS — BP 130/93 | HR 94 | Temp 98.4°F | Resp 18 | Ht 64.0 in | Wt 220.4 lb

## 2016-02-11 DIAGNOSIS — R232 Flushing: Secondary | ICD-10-CM

## 2016-02-11 DIAGNOSIS — C50212 Malignant neoplasm of upper-inner quadrant of left female breast: Secondary | ICD-10-CM | POA: Diagnosis not present

## 2016-02-11 DIAGNOSIS — R7989 Other specified abnormal findings of blood chemistry: Secondary | ICD-10-CM

## 2016-02-11 DIAGNOSIS — C50912 Malignant neoplasm of unspecified site of left female breast: Secondary | ICD-10-CM

## 2016-02-11 DIAGNOSIS — R748 Abnormal levels of other serum enzymes: Secondary | ICD-10-CM

## 2016-02-11 MED ORDER — TAMOXIFEN CITRATE 20 MG PO TABS: 20.0000 mg | ORAL_TABLET | Freq: Every day | ORAL | Status: DC

## 2016-02-11 MED ORDER — VENLAFAXINE HCL 75 MG PO TABS: 75.0000 mg | ORAL_TABLET | Freq: Every day | ORAL | Status: DC

## 2016-02-11 NOTE — Telephone Encounter (Signed)
appt made and avs printed °

## 2016-02-11 NOTE — Progress Notes (Signed)
ID: Deanna Barry   DOB: 1961-03-05  MR#: [id number]  BSW#:[id number]  PCP: Deanna Bellows, MD GYN: Deanna Barry SU:  OTHER MD: Deanna Barry. Deanna Barry  CHIEF COMPLAINT: Estrogen receptor positive breast cancer  CURRENT TREATMENT: Tamoxifen  HISTORY OF PRESENT ILLNESS: As per previously documented note:  Deanna Barry had screening mammography August 2011, which noted dense breasts.  In addition, calcifications were noted on the left side and she was brought back for additional imaging May 07, 2010.  Dr Deanna Barry felt these were benign and he saw no change in breast architecture as compared to July 2009, which was the prior mammogram.  Accordingly, a 62-monthrepeat mammography for followup of the calcifications was scheduled.  This was performed November 23, 2010.  Dr Deanna Baldyfound that the previously noted calcifications were stable but that there was a partially circumscribed mass now noted in the lower inner quadrant of the left breast.  By ultrasound this was lobulated and hypoechoic and measured approximately 8 mm.  Accordingly, the patient was brought back for ultrasound-guided biopsy November 25, 2010, and this showed [phone] an invasive ductal carcinoma that appeared to be low grade and was associated with abundant extracellular mucin.  The tumor showed no HER-2/neu amplification, with a CIS ratio of 1.26.  It was strongly estrogen and progesterone receptor positive at 96% and 94% respectively.  The proliferation marker was 20%.    With this information, the patient was referred to our multidisciplinary clinic and bilateral breast MRIs were obtained November 30, 2010.  This showed the left breast mass to measure 1.7 cm and also noted a skin lesion or sebaceous epithelial inclusion cyst at the junction of the middle and posterior third of the breast, 4 o'clock position, associated with the skin.  There was an incidentally noted small right pleural effusion and a pulmonary  nodule in the left upper lobe, which was seen on CT scan from July 2006 (measuring 8 mm at that time).    Patient underwent left mastectomy and sentinel lymph node sampling with simple right mastectomy in Deanna 2012 for a T1CN0, grade 1, invasive ductal carcinoma. Tumor was strongly ER and PR positive, HER-2/neu equivocal, with an Oncotype DX which quoted her a risk of recurrence at 10 years of 9% if she takes 5 years of tamoxifen. Patient was started on tamoxifen in May of 2012, with good tolerance. Patient is known to be BRCA1 and BRCA2 negative.  Her subsequent history is as detailed below  INTERVAL HISTORY: DJackelyn Polingreturns today for followup visit for her estrogen receptor positive breast cancer. She continues on tamoxifen with excellent tolerance. She has some daytime hot flashes, but the nighttime hot flashes have largely resolved. She does have toe problems that isn't going to undergo toe surgery 03/22/2016. In the meantime she is taking some modicum which is helping with that.   REVIEW OF SYSTEMS: She has a new membership and plan at fitness which she has yet to make use of. She has an elliptical at home but no room for it in the house. In short she is not exercising at this point. A detailed review of systems today was otherwise stable  PAST M EDICAL HISTORY: Past Medical History  Diagnosis Date  . Anemia     in the past, bruises easily  . Arthritis   . GERD (gastroesophageal reflux disease)   . Hypothyroidism   . Headache(784.0)     hx. migraines  . Sleep apnea  no CPAP; occ. wakes up gasping/choking  . Hypertension     under control; has been on med. x 1-2 yrs.  . TMJ syndrome   . Abrasion of right knee 10/2011    fell on ice  . Complication of anesthesia 12/28/2010    woke up combative  . Breast cancer, left breast ([place]) 01/05/2012  . Family history of breast cancer   . Family history of colon cancer   . Family history of ovarian cancer   . Family history of kidney cancer    . Chest pain 07/27/2015    PAST SURGICAL HISTORY: Past Surgical History  Procedure Laterality Date  . Foot surgery      BOTH  . Forearm surgery      RIGHT  . Uvulopalatopharyngoplasty    . Dilation and curettage of uterus  07/16/2004    hysteroscopy  . Thyroid lobectomy  04/14/2004    left; isthmusectomy  . Elbow arthroscopy  12/10/2003    left  . Mastectomy  12/28/2010    bilat.  . Breast reconstruction  11/17/2011    Procedure: BREAST RECONSTRUCTION;  Surgeon: Deanna Kos, DO;  Location: [facility];  Service: Plastics;  Laterality: Bilateral;  BILATERAL BREAST RECONSTRUCTION WITH EXCISON AND REPAIR EXCESS SKIN ON LATERAL ASPECT AND AXILLARY AREAST WITH SUCTION ASSISTED LIPECTOMY    FAMILY HISTORY Family History  Problem Relation Age of Onset  . Breast cancer Mother 60  . Kidney cancer Mother 78  . Colon cancer Maternal Grandmother     dx in her 57s  . Breast cancer Paternal Grandmother     dx under 30s  . Ovarian cancer Paternal Grandmother   . Lung cancer Paternal Grandfather     smoker  . Hodgkin's lymphoma Paternal Aunt   . Prostate cancer Maternal Grandfather   . Cancer Cousin     Maternal first cousin with cancer NOS   GYNECOLOGIC HISTORY:  The patient is GX, P0.  She had her first period at age 1.  Most recent period was November 25, 2010.    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  She lives with her partner, Deanna Barry.  Deanna Barry is thinking of retiring perhaps in late 2017. If that happens, she will probably move to [place] at that time.    ADVANCED DIRECTIVES: Not in place  HEALTH MAINTENANCE: Social History  Substance Use Topics  . Smoking status: Former Smoker -- 1.00 packs/day for 12 years    Types: Cigarettes  . Smokeless tobacco: Never Used  . Alcohol Use: Yes     Comment: socially     Colonoscopy: 2013/ [facility]  PAP:  Bone density:  Lipid panel:  Allergies  Allergen Reactions  . Augmentin [Amoxicillin-Pot  Clavulanate] Rash  . Other Other (See Comments)    SCENTED LOTIONS - MIGRAINES  . Penicillins Cross Reactors Itching and Rash    Current Outpatient Prescriptions  Medication Sig Dispense Refill  . atenolol (TENORMIN) 25 MG tablet Take 1 tablet (25 mg total) by mouth daily. AM 30 tablet 1  . atorvastatin (LIPITOR) 40 MG tablet Take 40 mg by mouth daily.    Marland Kitchen levothyroxine (SYNTHROID, LEVOTHROID) 25 MCG tablet Take 1 tablet (25 mcg total) by mouth daily. AM 30 tablet 2  . meloxicam (MOBIC) 15 MG tablet Take 15 mg by mouth daily.  0  . omeprazole (PRILOSEC) 20 MG capsule Take 20 mg by mouth daily. AM    . tamoxifen (NOLVADEX) 20 MG tablet Take  1 tablet (20 mg total) by mouth daily. AM 90 tablet 3  . venlafaxine (EFFEXOR) 75 MG tablet Take 1 tablet (75 mg total) by mouth daily. AM 60 tablet 5   No current facility-administered medications for this visit.    OBJECTIVE: Middle-aged white woman In no acute distress  Filed Vitals:   02/11/16 0925  BP: 130/93  Pulse: 94  Temp: 98.4 F (36.9 C)  Resp: 18     Body mass index is 37.81 kg/(m^2).     Sclerae unicteric, EOMs intact Oropharynx clear, dentition in good repair No cervical or supraclavicular adenopathy Lungs no rales or rhonchi Heart regular rate and rhythm Abd soft, nontender, positive bowel sounds MSK no focal spinal tenderness, no upper extremity lymphedema Neuro: nonfocal, well oriented, appropriate affect Breasts: Status post bilateral mastectomies. There is no evidence of chest wall recurrence. Both axillae are benign.   LABS    Component Value Date/Time   LABSPEC 1.030 07/30/2013 1351   PHURINE 6.0 07/30/2013 1351   GLUCOSEU Negative 07/30/2013 1351   HGBUR Moderate 07/30/2013 1351   BILIRUBINUR Negative 07/30/2013 1351   KETONESUR Negative 07/30/2013 1351   PROTEINUR < 30 07/30/2013 1351   UROBILINOGEN 0.2 07/30/2013 1351   NITRITE Negative 07/30/2013 1351   LEUKOCYTESUR Negative 07/30/2013 1351     Lab  Results  Component Value Date   WBC 6.9 02/04/2016   NEUTROABS 3.3 02/04/2016   HGB 14.5 02/04/2016   HCT 43.4 02/04/2016   MCV 88.3 02/04/2016   PLT 201 02/04/2016      Chemistry      Component Value Date/Time   NA 146* 02/04/2016 1605   NA 138 12/30/2011 0950   K 4.2 02/04/2016 1605   K 4.5 12/30/2011 0950   CL 106 12/27/2012 0728   CL 103 12/30/2011 0950   CO2 31* 02/04/2016 1605   CO2 28 12/30/2011 0950   BUN 22.2 02/04/2016 1605   BUN 23 12/30/2011 0950   CREATININE 0.9 02/04/2016 1605   CREATININE 0.79 12/30/2011 0950      Component Value Date/Time   CALCIUM 9.8 02/04/2016 1605   CALCIUM 9.1 12/30/2011 0950   ALKPHOS 62 02/04/2016 1605   ALKPHOS 63 12/30/2011 0950   AST 56* 02/04/2016 1605   AST 24 12/30/2011 0950   ALT 61* 02/04/2016 1605   ALT 29 12/30/2011 0950   BILITOT 0.71 02/04/2016 1605   BILITOT 0.5 12/30/2011 0950       Lab Results  Component Value Date   LABCA2 21 12/27/2012       ASSESSMENT: 55 y.o.  BRCA negative Deanna Barry woman   (1)  status post left mastectomy and sentinel lymph node sampling with simple right mastectomy, Deanna 2012, for a T1c N0 grade1 invasive ductal carcinoma, strongly estrogen and progesterone receptor positive, HER-2 was 1.26 and 2.04 ("equivocal" per 2012 guidelines) on separate determinations  (2) Oncotype DX which quotes her a risk of recurrence at 10 years of 9% if her only systemic therapy is 5 years of tamoxifen. (HER-2 was also negative per Oncotype)    (3)  started on tamoxifen May 2012, to be continued for total of 10 years  (4) broader GENETICS TESTING 06/24/2015 canceled because of cost concerns  (5) mild increase in liver function tests noted May 2017  PLAN: Deanna Barry is now a little over 5 years out from definitive surgery for her breast cancer with no evidence of disease recurrence. This is very favorable.  She is 5 years into tamoxifen with  excellent tolerance. The plan is to continue for a total  of 10 years. This will bring her risk of recurrence to less than 5%  We discussed her slight elevation in liver function tests today. After much discussion we decided not to proceed directly to [place] has lost a lot of workdays recently--instead we will repeat the test on July 7. If still elevated we will proceed to ultrasound at that time. She understands the most likely reason for this is hepatic steatosis.  With that in mind I have also encouraged her to exercise on a more regular basis. She does have a gym number should but she has not been making use of it.  She may be moving to [place] after retirement this December, but she was to continue follow-up here. We can easily arrange for that. Chauncey Cruel, MD  Medical oncology    02/11/2016

## 2016-02-11 NOTE — Telephone Encounter (Signed)
Order for ultrasound entered per this RN per MD dictation.  Pt made aware of above with appreciation.  No other needs at this time.

## 2016-02-11 NOTE — Telephone Encounter (Signed)
Pt called stating that at her appt today Dr Jana Hakim gave her the option of having an [place] of her liver or rechecking labs in about 6 weeks. She chose rechecking labs. She called to say she changed her mind and would rather have an [place] of her liver done as soon as possible. She is worrying about it.

## 2016-02-13 IMAGING — NM NM MISC PROCEDURE
6 series · 36 of 36 positions shown · non-contrast
Comparison: none

[Series 1: wbr_r-proj_st wbr rest · 6.40mm/px · 6 of 64 frames shown]
[frame 6/64]
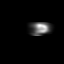
[frame 16/64]
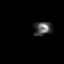
[frame 27/64]
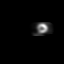
[frame 38/64]
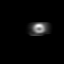
[frame 48/64]
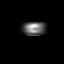
[frame 59/64]
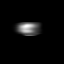

[Series 1: wbr rest · 6.40mm/px · 6 of 64 frames shown]
[frame 6/64]
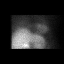
[frame 16/64]
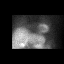
[frame 27/64]
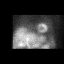
[frame 38/64]
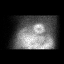
[frame 48/64]
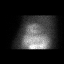
[frame 59/64]
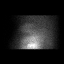

[Series 2: wbr stress-gsp · 6.40mm/px · 6 of 512 frames shown]
[frame 43/512]
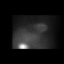
[frame 128/512]
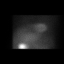
[frame 214/512]
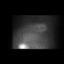
[frame 299/512]
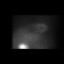
[frame 384/512]
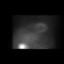
[frame 470/512]
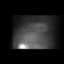

[Series 2: wbr_s-proj_st wbr stress-gsp · 6.40mm/px · 6 of 512 frames shown]
[frame 43/512]
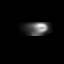
[frame 128/512]
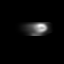
[frame 214/512]
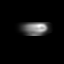
[frame 299/512]
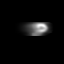
[frame 384/512]
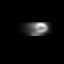
[frame 470/512]
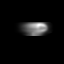

[Series 3: wbr_s-proj_st wbr stress-sum-em · 6.40mm/px · 6 of 64 frames shown]
[frame 6/64]
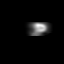
[frame 16/64]
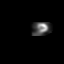
[frame 27/64]
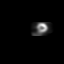
[frame 38/64]
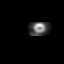
[frame 48/64]
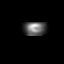
[frame 59/64]
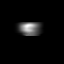

[Series 3: wbr stress-sum-em · 6.40mm/px · 6 of 64 frames shown]
[frame 6/64]
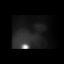
[frame 16/64]
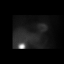
[frame 27/64]
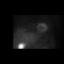
[frame 38/64]
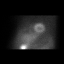
[frame 48/64]
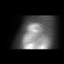
[frame 59/64]
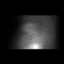

[36 of 36 positions shown; findings below may reference images not displayed]

Canned report from images found in remote index.

Refer to host system for actual result text.

## 2016-02-22 ENCOUNTER — Ambulatory Visit (HOSPITAL_COMMUNITY): Payer: 59

## 2016-02-23 ENCOUNTER — Other Ambulatory Visit: Payer: Self-pay | Admitting: Oncology

## 2016-02-23 DIAGNOSIS — C50212 Malignant neoplasm of upper-inner quadrant of left female breast: Secondary | ICD-10-CM

## 2016-02-23 DIAGNOSIS — R748 Abnormal levels of other serum enzymes: Secondary | ICD-10-CM

## 2016-02-24 ENCOUNTER — Ambulatory Visit (HOSPITAL_COMMUNITY)
Admission: RE | Admit: 2016-02-24 | Discharge: 2016-02-24 | Disposition: A | Discharge status: Home / Self Care | Payer: 59 | Source: Ambulatory Visit | Attending: Oncology | Admitting: Oncology

## 2016-02-24 DIAGNOSIS — R748 Abnormal levels of other serum enzymes: Secondary | ICD-10-CM | POA: Diagnosis present

## 2016-02-24 DIAGNOSIS — K760 Fatty (change of) liver, not elsewhere classified: Secondary | ICD-10-CM | POA: Insufficient documentation

## 2016-02-24 DIAGNOSIS — N281 Cyst of kidney, acquired: Secondary | ICD-10-CM | POA: Insufficient documentation

## 2016-02-24 DIAGNOSIS — C50212 Malignant neoplasm of upper-inner quadrant of left female breast: Secondary | ICD-10-CM | POA: Insufficient documentation

## 2016-02-26 ENCOUNTER — Telehealth: Payer: Self-pay

## 2016-02-26 NOTE — Telephone Encounter (Signed)
Called patient back with results from her abdominal US.  Per Dr. Jana Hakim patient has a " fatty liver" which could be resolved be a low carb diet and exercise.

## 2016-03-18 ENCOUNTER — Encounter (HOSPITAL_BASED_OUTPATIENT_CLINIC_OR_DEPARTMENT_OTHER): Payer: Self-pay | Admitting: *Deleted

## 2016-03-18 ENCOUNTER — Other Ambulatory Visit: Payer: Self-pay | Admitting: Orthopedic Surgery

## 2016-03-22 ENCOUNTER — Encounter (HOSPITAL_BASED_OUTPATIENT_CLINIC_OR_DEPARTMENT_OTHER): Payer: Self-pay

## 2016-03-22 ENCOUNTER — Ambulatory Visit (HOSPITAL_BASED_OUTPATIENT_CLINIC_OR_DEPARTMENT_OTHER): Payer: 59 | Admitting: Anesthesiology

## 2016-03-22 ENCOUNTER — Encounter (HOSPITAL_BASED_OUTPATIENT_CLINIC_OR_DEPARTMENT_OTHER)
Admission: RE | Disposition: A | Discharge status: Home / Self Care | Payer: Self-pay | Source: Ambulatory Visit | Attending: Orthopedic Surgery

## 2016-03-22 ENCOUNTER — Ambulatory Visit (HOSPITAL_BASED_OUTPATIENT_CLINIC_OR_DEPARTMENT_OTHER)
Admission: RE | Admit: 2016-03-22 | Discharge: 2016-03-22 | Disposition: A | Discharge status: Home / Self Care | Payer: 59 | Source: Ambulatory Visit | Attending: Orthopedic Surgery | Admitting: Orthopedic Surgery

## 2016-03-22 DIAGNOSIS — M2021 Hallux rigidus, right foot: Secondary | ICD-10-CM | POA: Insufficient documentation

## 2016-03-22 DIAGNOSIS — Z880 Allergy status to penicillin: Secondary | ICD-10-CM | POA: Diagnosis not present

## 2016-03-22 DIAGNOSIS — Z9889 Other specified postprocedural states: Secondary | ICD-10-CM

## 2016-03-22 DIAGNOSIS — I10 Essential (primary) hypertension: Secondary | ICD-10-CM | POA: Diagnosis not present

## 2016-03-22 DIAGNOSIS — K219 Gastro-esophageal reflux disease without esophagitis: Secondary | ICD-10-CM | POA: Diagnosis not present

## 2016-03-22 DIAGNOSIS — Z87891 Personal history of nicotine dependence: Secondary | ICD-10-CM | POA: Diagnosis not present

## 2016-03-22 DIAGNOSIS — E039 Hypothyroidism, unspecified: Secondary | ICD-10-CM | POA: Insufficient documentation

## 2016-03-22 DIAGNOSIS — Z9013 Acquired absence of bilateral breasts and nipples: Secondary | ICD-10-CM | POA: Diagnosis not present

## 2016-03-22 HISTORY — PX: CHEILECTOMY: SHX1336

## 2016-03-22 HISTORY — DX: Anxiety disorder, unspecified: F41.9

## 2016-03-22 SURGERY — CHEILECTOMY
Anesthesia: General | Site: Toe | Laterality: Right

## 2016-03-22 MED ORDER — CEFAZOLIN SODIUM-DEXTROSE 2-4 GM/100ML-% IV SOLN
INTRAVENOUS | Status: AC
  Filled 2016-03-22: qty 100

## 2016-03-22 MED ORDER — MIDAZOLAM HCL 2 MG/2ML IJ SOLN
1.0000 mg | INTRAMUSCULAR | Status: DC | PRN
  Administered 2016-03-22: 2 mg via INTRAVENOUS

## 2016-03-22 MED ORDER — SCOPOLAMINE 1 MG/3DAYS TD PT72: 1.0000 | MEDICATED_PATCH | Freq: Once | TRANSDERMAL | Status: DC | PRN

## 2016-03-22 MED ORDER — FENTANYL CITRATE (PF) 100 MCG/2ML IJ SOLN
INTRAMUSCULAR | Status: AC
  Filled 2016-03-22: qty 2

## 2016-03-22 MED ORDER — KETOROLAC TROMETHAMINE 30 MG/ML IJ SOLN
INTRAMUSCULAR | Status: DC | PRN
  Administered 2016-03-22: 30 mg via INTRAVENOUS

## 2016-03-22 MED ORDER — PROPOFOL 10 MG/ML IV BOLUS
INTRAVENOUS | Status: DC | PRN
  Administered 2016-03-22: 200 mg via INTRAVENOUS

## 2016-03-22 MED ORDER — ONDANSETRON HCL 4 MG/2ML IJ SOLN: 4.0000 mg | Freq: Once | INTRAMUSCULAR | Status: DC | PRN

## 2016-03-22 MED ORDER — OXYCODONE HCL 5 MG/5ML PO SOLN: 5.0000 mg | Freq: Once | ORAL | Status: DC | PRN

## 2016-03-22 MED ORDER — ONDANSETRON HCL 4 MG/2ML IJ SOLN
INTRAMUSCULAR | Status: DC | PRN
Start: 2016-03-22 — End: 2016-03-22
  Administered 2016-03-22: 4 mg via INTRAVENOUS

## 2016-03-22 MED ORDER — FENTANYL CITRATE (PF) 100 MCG/2ML IJ SOLN
50.0000 ug | INTRAMUSCULAR | Status: DC | PRN
  Administered 2016-03-22: 100 ug via INTRAVENOUS
  Administered 2016-03-22: 50 ug via INTRAVENOUS

## 2016-03-22 MED ORDER — BUPIVACAINE-EPINEPHRINE 0.25% -1:200000 IJ SOLN
INTRAMUSCULAR | Status: DC | PRN
  Administered 2016-03-22: 10 mL

## 2016-03-22 MED ORDER — CEFAZOLIN SODIUM-DEXTROSE 2-4 GM/100ML-% IV SOLN
2.0000 g | INTRAVENOUS | Status: AC
Start: 2016-03-22 — End: 2016-03-22
  Administered 2016-03-22: 2 g via INTRAVENOUS

## 2016-03-22 MED ORDER — OXYCODONE HCL 5 MG PO TABS: 5.0000 mg | ORAL_TABLET | ORAL | Status: DC | PRN

## 2016-03-22 MED ORDER — LIDOCAINE HCL (CARDIAC) 20 MG/ML IV SOLN
INTRAVENOUS | Status: DC | PRN
  Administered 2016-03-22: 50 mg via INTRAVENOUS

## 2016-03-22 MED ORDER — GLYCOPYRROLATE 0.2 MG/ML IJ SOLN
0.2000 mg | Freq: Once | INTRAMUSCULAR | Status: DC | PRN
Start: 2016-03-22 — End: 2016-03-22

## 2016-03-22 MED ORDER — OXYCODONE HCL 5 MG PO TABS: 5.0000 mg | ORAL_TABLET | Freq: Once | ORAL | Status: DC | PRN

## 2016-03-22 MED ORDER — MIDAZOLAM HCL 2 MG/2ML IJ SOLN
INTRAMUSCULAR | Status: AC
  Filled 2016-03-22: qty 2

## 2016-03-22 MED ORDER — CHLORHEXIDINE GLUCONATE 4 % EX LIQD: 60.0000 mL | Freq: Once | CUTANEOUS | Status: DC

## 2016-03-22 MED ORDER — PROPOFOL 10 MG/ML IV BOLUS
INTRAVENOUS | Status: AC
  Filled 2016-03-22: qty 20

## 2016-03-22 MED ORDER — HYDROMORPHONE HCL 1 MG/ML IJ SOLN: 0.2500 mg | INTRAMUSCULAR | Status: DC | PRN

## 2016-03-22 MED ORDER — BUPIVACAINE-EPINEPHRINE (PF) 0.5% -1:200000 IJ SOLN
INTRAMUSCULAR | Status: AC
  Filled 2016-03-22: qty 30

## 2016-03-22 MED ORDER — DEXAMETHASONE SODIUM PHOSPHATE 10 MG/ML IJ SOLN
INTRAMUSCULAR | Status: AC
  Filled 2016-03-22: qty 1

## 2016-03-22 MED ORDER — LACTATED RINGERS IV SOLN
INTRAVENOUS | Status: DC
  Administered 2016-03-22 (×2): via INTRAVENOUS

## 2016-03-22 MED ORDER — SODIUM CHLORIDE 0.9 % IV SOLN: INTRAVENOUS | Status: DC

## 2016-03-22 MED ORDER — DEXAMETHASONE SODIUM PHOSPHATE 10 MG/ML IJ SOLN
INTRAMUSCULAR | Status: DC | PRN
  Administered 2016-03-22: 10 mg via INTRAVENOUS

## 2016-03-22 SURGICAL SUPPLY — 71 items
BANDAGE ESMARK 6X9 LF (GAUZE/BANDAGES/DRESSINGS) ×1 IMPLANT
BLADE AVERAGE 25MMX9MM (BLADE)
BLADE AVERAGE 25X9 (BLADE) IMPLANT
BLADE MICRO SAGITTAL (BLADE) IMPLANT
BLADE OSC/SAG .038X5.5 CUT EDG (BLADE) IMPLANT
BLADE SURG 15 STRL LF DISP TIS (BLADE) ×2 IMPLANT
BLADE SURG 15 STRL SS (BLADE) ×6
BNDG CMPR 9X6 STRL LF SNTH (GAUZE/BANDAGES/DRESSINGS) ×1
BNDG COHESIVE 4X5 TAN STRL (GAUZE/BANDAGES/DRESSINGS) ×3 IMPLANT
BNDG COHESIVE 6X5 TAN STRL LF (GAUZE/BANDAGES/DRESSINGS) IMPLANT
BNDG CONFORM 3 STRL LF (GAUZE/BANDAGES/DRESSINGS) ×2 IMPLANT
BNDG ESMARK 6X9 LF (GAUZE/BANDAGES/DRESSINGS) ×3
CHLORAPREP W/TINT 26ML (MISCELLANEOUS) ×3 IMPLANT
COVER BACK TABLE 60X90IN (DRAPES) ×3 IMPLANT
CUFF TOURNIQUET SINGLE 34IN LL (TOURNIQUET CUFF) ×3 IMPLANT
DRAPE EXTREMITY T 121X128X90 (DRAPE) ×3 IMPLANT
DRAPE OEC MINIVIEW 54X84 (DRAPES) ×3 IMPLANT
DRAPE U-SHAPE 47X51 STRL (DRAPES) ×3 IMPLANT
DRSG MEPITEL 4X7.2 (GAUZE/BANDAGES/DRESSINGS) ×5 IMPLANT
DRSG PAD ABDOMINAL 8X10 ST (GAUZE/BANDAGES/DRESSINGS) ×3 IMPLANT
ELECT REM PT RETURN 9FT ADLT (ELECTROSURGICAL) ×3
ELECTRODE REM PT RTRN 9FT ADLT (ELECTROSURGICAL) ×1 IMPLANT
GAUZE SPONGE 4X4 12PLY STRL (GAUZE/BANDAGES/DRESSINGS) ×3 IMPLANT
GLOVE BIO SURGEON STRL SZ7 (GLOVE) ×2 IMPLANT
GLOVE BIO SURGEON STRL SZ8 (GLOVE) ×3 IMPLANT
GLOVE BIOGEL PI IND STRL 7.0 (GLOVE) IMPLANT
GLOVE BIOGEL PI IND STRL 8 (GLOVE) ×2 IMPLANT
GLOVE BIOGEL PI IND STRL 8.5 (GLOVE) IMPLANT
GLOVE BIOGEL PI INDICATOR 7.0 (GLOVE) ×6
GLOVE BIOGEL PI INDICATOR 8 (GLOVE) ×4
GLOVE BIOGEL PI INDICATOR 8.5 (GLOVE) ×2
GLOVE ECLIPSE 6.5 STRL STRAW (GLOVE) ×2 IMPLANT
GLOVE ECLIPSE 7.5 STRL STRAW (GLOVE) ×3 IMPLANT
GLOVE EXAM NITRILE MD LF STRL (GLOVE) IMPLANT
GLOVE SURG SS PI 8.0 STRL IVOR (GLOVE) ×2 IMPLANT
GOWN STRL REUS W/ TWL LRG LVL3 (GOWN DISPOSABLE) ×1 IMPLANT
GOWN STRL REUS W/ TWL XL LVL3 (GOWN DISPOSABLE) ×2 IMPLANT
GOWN STRL REUS W/TWL LRG LVL3 (GOWN DISPOSABLE) ×6
GOWN STRL REUS W/TWL XL LVL3 (GOWN DISPOSABLE) ×9
IMPL MTP CARTIVA 10MM (Orthopedic Implant) IMPLANT
IMPLANT MTP CARTIVA 10MM (Orthopedic Implant) ×3 IMPLANT
NEEDLE HYPO 22GX1.5 SAFETY (NEEDLE) ×2 IMPLANT
PACK BASIN DAY SURGERY FS (CUSTOM PROCEDURE TRAY) ×3 IMPLANT
PAD CAST 4YDX4 CTTN HI CHSV (CAST SUPPLIES) ×1 IMPLANT
PADDING CAST ABS 4INX4YD NS (CAST SUPPLIES)
PADDING CAST ABS COTTON 4X4 ST (CAST SUPPLIES) IMPLANT
PADDING CAST COTTON 4X4 STRL (CAST SUPPLIES) ×3
PADDING CAST COTTON 6X4 STRL (CAST SUPPLIES) IMPLANT
PENCIL BUTTON HOLSTER BLD 10FT (ELECTRODE) ×3 IMPLANT
SANITIZER HAND PURELL 535ML FO (MISCELLANEOUS) ×3 IMPLANT
SHEET MEDIUM DRAPE 40X70 STRL (DRAPES) ×3 IMPLANT
SLEEVE SCD COMPRESS KNEE MED (MISCELLANEOUS) ×3 IMPLANT
SPLINT FAST PLASTER 5X30 (CAST SUPPLIES)
SPLINT PLASTER CAST FAST 5X30 (CAST SUPPLIES) IMPLANT
SPONGE LAP 18X18 X RAY DECT (DISPOSABLE) ×3 IMPLANT
SPONGE SURGIFOAM ABS GEL 12-7 (HEMOSTASIS) IMPLANT
STOCKINETTE 6  STRL (DRAPES) ×2
STOCKINETTE 6 STRL (DRAPES) ×1 IMPLANT
SUCTION FRAZIER HANDLE 10FR (MISCELLANEOUS) ×2
SUCTION TUBE FRAZIER 10FR DISP (MISCELLANEOUS) ×1 IMPLANT
SUT ETHILON 3 0 PS 1 (SUTURE) ×5 IMPLANT
SUT MNCRL AB 3-0 PS2 18 (SUTURE) ×5 IMPLANT
SUT VIC AB 0 SH 27 (SUTURE) IMPLANT
SUT VIC AB 2-0 SH 27 (SUTURE) ×3
SUT VIC AB 2-0 SH 27XBRD (SUTURE) IMPLANT
SYR BULB 3OZ (MISCELLANEOUS) ×3 IMPLANT
SYR CONTROL 10ML LL (SYRINGE) ×2 IMPLANT
TOWEL OR 17X24 6PK STRL BLUE (TOWEL DISPOSABLE) ×6 IMPLANT
TUBE CONNECTING 20'X1/4 (TUBING) ×1
TUBE CONNECTING 20X1/4 (TUBING) ×2 IMPLANT
UNDERPAD 30X30 (UNDERPADS AND DIAPERS) ×3 IMPLANT

## 2016-03-22 NOTE — Brief Op Note (Signed)
03/22/2016  1:05 PM  PATIENT:  Deanna Barry  55 y.o. female  PRE-OPERATIVE DIAGNOSIS:  RIGHT HALLUX RIGIDUS   POST-OPERATIVE DIAGNOSIS:  RIGHT HALLUX RIGIDUS  Procedure(s):  Right hallux MPJ cheilectomy and joint resurfacing Robyne Peers)  SURGEON:  Wylene Simmer, MD  ASSISTANT: Mechele Claude, PA-C  ANESTHESIA:   General  EBL:  minimal   TOURNIQUET:  22 min at [id number] mm Hg  COMPLICATIONS:  None apparent  DISPOSITION:  Extubated, awake and stable to recovery.  DICTATION ID:  [id number]

## 2016-03-22 NOTE — H&P (Signed)
Deanna Barry is an 55 y.o. female.   Chief Complaint:  Right foot pain HPI:  55 y/o female without significant PMH c/o right forefoot pain that has been progressive over recent months.  She has failed non op treatment to date and presents today for surgery.  Past Medical History  Diagnosis Date  . Arthritis   . GERD (gastroesophageal reflux disease)   . Hypothyroidism   . Headache(784.0)     hx. migraines  . Sleep apnea     no CPAP; occ. wakes up gasping/choking  . Hypertension     under control; has been on med. x 1-2 yrs.  . TMJ syndrome   . Abrasion of right knee 10/2011    fell on ice  . Complication of anesthesia 12/28/2010    woke up combative  . Breast cancer, left breast ([place]) 01/05/2012  . Family history of breast cancer   . Family history of colon cancer   . Family history of ovarian cancer   . Family history of kidney cancer   . Chest pain 07/27/2015  . Anxiety     Past Surgical History  Procedure Laterality Date  . Foot surgery      BOTH  . Forearm surgery      RIGHT  . Uvulopalatopharyngoplasty    . Dilation and curettage of uterus  07/16/2004    hysteroscopy  . Thyroid lobectomy  04/14/2004    left; isthmusectomy  . Elbow arthroscopy  12/10/2003    left  . Mastectomy  12/28/2010    bilat.  . Breast reconstruction  11/17/2011    Procedure: BREAST RECONSTRUCTION;  Surgeon: Theodoro Kos, DO;  Location: [facility];  Service: Plastics;  Laterality: Bilateral;  BILATERAL BREAST RECONSTRUCTION WITH EXCISON AND REPAIR EXCESS SKIN ON LATERAL ASPECT AND AXILLARY AREAST WITH SUCTION ASSISTED LIPECTOMY    Family History  Problem Relation Age of Onset  . Breast cancer Mother 41  . Kidney cancer Mother 8  . Colon cancer Maternal Grandmother     dx in her 12s  . Breast cancer Paternal Grandmother     dx under 45s  . Ovarian cancer Paternal Grandmother   . Lung cancer Paternal Grandfather     smoker  . Hodgkin's lymphoma Paternal Aunt   . Prostate  cancer Maternal Grandfather   . Cancer Cousin     Maternal first cousin with cancer NOS   Social History:  reports that she has quit smoking. Her smoking use included Cigarettes. She has a 12 pack-year smoking history. She has never used smokeless tobacco. She reports that she drinks alcohol. She reports that she does not use illicit drugs.  Allergies:  Allergies  Allergen Reactions  . Augmentin [Amoxicillin-Pot Clavulanate] Rash  . Ciprofloxacin Hcl Rash  . Other Other (See Comments)    SCENTED LOTIONS - MIGRAINES  . Penicillins Cross Reactors Itching and Rash    Medications Prior to Admission  Medication Sig Dispense Refill  . atenolol (TENORMIN) 25 MG tablet Take 1 tablet (25 mg total) by mouth daily. AM 30 tablet 1  . atorvastatin (LIPITOR) 40 MG tablet Take 40 mg by mouth daily.    Marland Kitchen levothyroxine (SYNTHROID, LEVOTHROID) 25 MCG tablet Take 1 tablet (25 mcg total) by mouth daily. AM 30 tablet 2  . meloxicam (MOBIC) 15 MG tablet Take 15 mg by mouth daily.  0  . omeprazole (PRILOSEC) 20 MG capsule Take 20 mg by mouth daily. AM    . tamoxifen (NOLVADEX) 20  MG tablet Take 1 tablet (20 mg total) by mouth daily. AM 90 tablet 3  . venlafaxine (EFFEXOR) 75 MG tablet Take 1 tablet (75 mg total) by mouth daily. AM 60 tablet 5    No results found for this or any previous visit (from the past 48 hour(s)). No results found.  ROS  No recent f/c/n/v/wt loss  Blood pressure 133/86, pulse 71, temperature 98.5 F (36.9 C), temperature source Oral, resp. rate 18, height 5\' 4"  (1.626 m), weight 96.616 kg (213 lb), last menstrual period 12/15/2010, SpO2 96 %. Physical Exam  wn wd woman in nad.  A and Ox 4.  Mood and affect normal.  EOMi.  resp unlabored.  R forefoot with decreased rom at the hallux MPJ.  SKin healthy and intact.  No lymphadenopathy. 5/5 strength in PF dn DF of the toes.  2+ dp and pt pulses.  Feels LT normally throughout the foot.   Assessment/Plan R hallux rigidus.  To OR for  hallux MPJ cheilectomy and joint resurfacing with the Cartiva implant.  The risks and benefits of the alternative treatment options have been discussed in detail.  The patient wishes to proceed with surgery and specifically understands risks of bleeding, infection, nerve damage, blood clots, need for additional surgery, amputation and death.   Wylene Simmer, MD 04-01-2016, 11:00 AM

## 2016-03-22 NOTE — Discharge Instructions (Addendum)
Wylene Simmer, MD [place]  Please read the following information regarding your care after surgery.  Medications  You only need a prescription for the narcotic pain medicine (ex. oxycodone, Percocet, Norco).  All of the other medicines listed below are available over the counter. X acetominophen (Tylenol) 650 mg every 4-6 hours as you need for minor pain X oxycodone as prescribed for moderate to severe pain ?   Narcotic pain medicine (ex. oxycodone, Percocet, Vicodin) will cause constipation.  To prevent this problem, take the following medicines while you are taking any pain medicine. X docusate sodium (Colace) 100 mg twice a day X senna (Senokot) 2 tablets twice a day   Weight Bearing X Bear weight when you are able on your operated leg or foot in flat post-op shoe.   Cast / Splint / Dressing X Keep your splint or cast clean and dry.  Dont put anything (coat hanger, pencil, etc) down inside of it.  If it gets damp, use a hair dryer on the cool setting to dry it.  If it gets soaked, call the office to schedule an appointment for a cast change.   After your dressing, cast or splint is removed; you may shower, but do not soak or scrub the wound.  Allow the water to run over it, and then gently pat it dry.  Swelling It is normal for you to have swelling where you had surgery.  To reduce swelling and pain, keep your toes above your nose for at least 3 days after surgery.  It may be necessary to keep your foot or leg elevated for several weeks.  If it hurts, it should be elevated.  Follow Up Call my office at [phone] when you are discharged from the hospital or surgery center to schedule an appointment to be seen two weeks after surgery.  Call my office at [phone] if you develop a fever >101.5 F, nausea, vomiting, bleeding from the surgical site or severe pain.     Post Anesthesia Home Care Instructions  Activity: Get plenty of rest for the remainder of the  day. A responsible adult should stay with you for 24 hours following the procedure.  For the next 24 hours, DO NOT: -Drive a car -Paediatric nurse -Drink alcoholic beverages -Take any medication unless instructed by your physician -Make any legal decisions or sign important papers.  Meals: Start with liquid foods such as gelatin or soup. Progress to regular foods as tolerated. Avoid greasy, spicy, heavy foods. If nausea and/or vomiting occur, drink only clear liquids until the nausea and/or vomiting subsides. Call your physician if vomiting continues.  Special Instructions/Symptoms: Your throat may feel dry or sore from the anesthesia or the breathing tube placed in your throat during surgery. If this causes discomfort, gargle with warm salt water. The discomfort should disappear within 24 hours.  If you had a scopolamine patch placed behind your ear for the management of post- operative nausea and/or vomiting:  1. The medication in the patch is effective for 72 hours, after which it should be removed.  Wrap patch in a tissue and discard in the trash. Wash hands thoroughly with soap and water. 2. You may remove the patch earlier than 72 hours if you experience unpleasant side effects which may include dry mouth, dizziness or visual disturbances. 3. Avoid touching the patch. Wash your hands with soap and water after contact with the patch.

## 2016-03-22 NOTE — Anesthesia Procedure Notes (Signed)
Procedure Name: LMA Insertion Date/Time: 03/22/2016 12:28 PM Performed by: Maryella Shivers Pre-anesthesia Checklist: Patient identified, Emergency Drugs available, Suction available and Patient being monitored Patient Re-evaluated:Patient Re-evaluated prior to inductionOxygen Delivery Method: Circle system utilized Preoxygenation: Pre-oxygenation with 100% oxygen Intubation Type: IV induction Ventilation: Mask ventilation without difficulty LMA: LMA inserted LMA Size: 4.0 Number of attempts: 1 Airway Equipment and Method: Bite block Placement Confirmation: positive ETCO2 Tube secured with: Tape Dental Injury: Teeth and Oropharynx as per pre-operative assessment

## 2016-03-22 NOTE — Transfer of Care (Signed)
Immediate Anesthesia Transfer of Care Note  Patient: Deanna Barry  Procedure(s) Performed: Procedure(s): RIGHT HALLUX METATARSALPHALANGEAL JOINT (MTPJ) RESURFACING CHEILECTOMY   (Right)  Patient Location: PACU  Anesthesia Type:General  Level of Consciousness: awake, alert  and oriented  Airway & Oxygen Therapy: Patient Spontanous Breathing and Patient connected to face mask oxygen  Post-op Assessment: Report given to RN and Post -op Vital signs reviewed and stable  Post vital signs: Reviewed and stable  Last Vitals:  Filed Vitals:   03/22/16 0938  BP: 133/86  Pulse: 71  Temp: 36.9 C  Resp: 18    Last Pain: There were no vitals filed for this visit.       Complications: No apparent anesthesia complications

## 2016-03-22 NOTE — Anesthesia Postprocedure Evaluation (Signed)
Anesthesia Post Note  Patient: Deanna Barry  Procedure(s) Performed: Procedure(s) (LRB): RIGHT HALLUX METATARSALPHALANGEAL JOINT (MTPJ) RESURFACING CHEILECTOMY   (Right)  Anesthesia Type: General Level of consciousness: awake, awake and alert and oriented Pain management: pain level controlled Vital Signs Assessment: post-procedure vital signs reviewed and stable Respiratory status: spontaneous breathing, nonlabored ventilation and respiratory function stable Cardiovascular status: blood pressure returned to baseline Anesthetic complications: no    Last Vitals:  Filed Vitals:   03/22/16 1438 03/22/16 1455  BP:    Pulse: 72   Temp:    Resp: 18 16    Last Pain:  Filed Vitals:   03/22/16 1512  PainSc: 2                  [name]

## 2016-03-22 NOTE — Anesthesia Preprocedure Evaluation (Addendum)
Anesthesia Evaluation  Patient identified by MRN, date of birth, ID band Patient awake    Reviewed: Allergy & Precautions, NPO status , Patient's Chart, lab work & pertinent test results  Airway Mallampati: II  TM Distance: >3 FB Neck ROM: Full    Dental  (+) Teeth Intact, Dental Advisory Given   Pulmonary former smoker,    breath sounds clear to auscultation       Cardiovascular hypertension,  Rhythm:Regular Rate:Normal     Neuro/Psych    GI/Hepatic   Endo/Other    Renal/GU      Musculoskeletal   Abdominal   Peds  Hematology   Anesthesia Other Findings   Reproductive/Obstetrics                            Anesthesia Physical Anesthesia Plan  ASA: II  Anesthesia Plan: General   Post-op Pain Management:    Induction: Intravenous  Airway Management Planned: LMA  Additional Equipment:   Intra-op Plan:   Post-operative Plan:   Informed Consent: I have reviewed the patients History and Physical, chart, labs and discussed the procedure including the risks, benefits and alternatives for the proposed anesthesia with the patient or authorized representative who has indicated his/her understanding and acceptance.   Dental advisory given  Plan Discussed with: CRNA and Anesthesiologist  Anesthesia Plan Comments:         Anesthesia Quick Evaluation

## 2016-03-23 ENCOUNTER — Encounter (HOSPITAL_BASED_OUTPATIENT_CLINIC_OR_DEPARTMENT_OTHER): Payer: Self-pay | Admitting: Orthopedic Surgery

## 2016-03-23 NOTE — Op Note (Signed)
NAMELAKETIA, BERKOWITZ               ACCOUNT NO.:  [id number]  MEDICAL RECORD NO.:  [id number]  LOCATION:                               FACILITY:  [facility]  PHYSICIAN:  Wylene Simmer, MD        DATE OF BIRTH:  June 26, 1961  DATE OF PROCEDURE:  03/22/2016 DATE OF DISCHARGE:                              OPERATIVE REPORT   PREOPERATIVE DIAGNOSIS:  Right hallux rigidus.  POSTOPERATIVE DIAGNOSIS:  Right hallux rigidus.  PROCEDURE:  Right hallux metatarsophalangeal joint cheilectomy and joint resurfacing (Cartiva).  SURGEON:  Wylene Simmer, MD  ASSISTANT:  Mechele Claude, PA-C  ANESTHESIA:  General.  ESTIMATED BLOOD LOSS:  Minimal.  TOURNIQUET TIME:  23 minutes at 250 mmHg.  COMPLICATIONS:  None apparent.  DISPOSITION:  Extubated, awake, and stable to recovery.  INDICATION FOR PROCEDURE:  The patient is a 55 year old woman who has a long history of right forefoot pain.  Signs and symptoms were consistent with right hallux rigidus.  She has failed nonoperative treatment to date including activity modification, oral anti-inflammatories, and shoe wear modification.  She presents today for operative treatment of this painful condition.  She understands the risks and benefits, the alternative treatment options, and elects surgical treatment.  She specifically understands risks of bleeding, infection, nerve damage, blood clots, need for additional surgery, continued pain, amputation, and death.  PROCEDURE IN DETAIL:  After preoperative consent was obtained and the correct operative site was identified, the patient was brought to the operating room and placed supine on the operating table.  General anesthesia was induced.  Preoperative antibiotics were administered. Surgical time-out was taken.  Right lower extremity was prepped and draped in standard sterile fashion with a tourniquet around the thigh. Extremity was exsanguinated and tourniquet was inflated to 250 mmHg.  A longitudinal  incision was made over the hallux MP joint.  Sharp dissection was carried down through skin and subcutaneous tissue.  The dorsal joint capsule was incised and elevated medially and laterally. The extensor hallucis longus and brevis tendons were protected throughout the case.  Head of the metatarsal was exposed.  All of the central cartilage was gone with subchondral bone exposed.  About 30% of the dorsal aspect of the proximal phalanx had absent cartilage and exposed subchondral bone as well.  Peripheral osteophytes were removed with a rongeur.  A K-wire was then inserted just slightly dorsal and lateral from the center point of the metatarsal head.  The K-wire was advanced into the metatarsal shaft.  The reamer was then inserted and advanced down almost to the level of the articular cartilage.  The K- wire was removed and all the bone fragments were removed.  Wound was irrigated copiously.  Then 10 mm Cartiva implant was then inserted to the appropriate depth.  The joint was reduced.  Dorsiflexion of the hallux was then noted to be approximately 75 degrees.  Plantar flexion approximately 75 degrees.  Wound was again irrigated.  Joint capsule was repaired with simple sutures of 2-0 Vicryl.  Subcutaneous tissues were approximated with inverted simple sutures of 3-0 Monocryl and a running 3-0 nylon was used to close the skin incision.  Sterile dressings were applied  followed by a compression wrap.  Tourniquet was released at 22 minutes after application of the dressings.  0.25% Marcaine with epinephrine was infiltrated into the subcutaneous tissues for postoperative pain control.  The patient was awakened by Anesthesia and transported to the recovery room in stable condition.  FOLLOWUP PLAN:  The patient will be weightbearing as tolerated on the right foot in a flat postop shoe.  She will follow up with me in the office in 2 weeks for suture removal.     Wylene Simmer,  MD   ______________________________ Wylene Simmer, MD    JH/MEDQ  D:  03/22/2016  T:  03/23/2016  Job:  [id number]

## 2016-03-23 NOTE — Op Note (Deleted)
[name], Deanna Barry               ACCOUNT NO.:  [id number]  MEDICAL RECORD NO.:  [id number]  LOCATION:                               FACILITY:  [facility]  PHYSICIAN:  Wylene Simmer, MD        DATE OF BIRTH:  05/20/1961  DATE OF PROCEDURE:  03/22/2016 DATE OF DISCHARGE:                              OPERATIVE REPORT   PREOPERATIVE DIAGNOSIS:  Right hallux rigidus.  POSTOPERATIVE DIAGNOSIS:  Right hallux rigidus.  PROCEDURE:  Right hallux metatarsophalangeal joint cheilectomy and joint resurfacing (Cartiva).  SURGEON:  Wylene Simmer, MD  ASSISTANT:  Mechele Claude, PA-C  ANESTHESIA:  General.  ESTIMATED BLOOD LOSS:  Minimal.  TOURNIQUET TIME:  23 minutes at 250 mmHg.  COMPLICATIONS:  None apparent.  DISPOSITION:  Extubated, awake, and stable to recovery.  INDICATION FOR PROCEDURE:  The patient is a 55 year old woman who has a long history of right forefoot pain.  Signs and symptoms were consistent with right hallux rigidus.  She has failed nonoperative treatment to date including activity modification, oral anti-inflammatories, and shoe wear modification.  She presents today for operative treatment of this painful condition.  She understands the risks and benefits, the alternative treatment options, and elects surgical treatment.  She specifically understands risks of bleeding, infection, nerve damage, blood clots, need for additional surgery, continued pain, amputation, and death.  PROCEDURE IN DETAIL:  After preoperative consent was obtained and the correct operative site was identified, the patient was brought to the operating room and placed supine on the operating table.  General anesthesia was induced.  Preoperative antibiotics were administered. Surgical time-out was taken.  Right lower extremity was prepped and draped in standard sterile fashion with a tourniquet around the thigh. Extremity was exsanguinated and tourniquet was inflated to 250 mmHg.  A longitudinal  incision was made over the hallux MP joint.  Sharp dissection was carried down through skin and subcutaneous tissue.  The dorsal joint capsule was incised and elevated medially and laterally. The extensor hallucis longus and brevis tendons were protected throughout the case.  Head of the metatarsal was exposed.  All of the central cartilage was gone with subchondral bone exposed.  About 30% of the dorsal aspect of the proximal phalanx had absent cartilage and exposed subchondral bone as well.  Peripheral osteophytes were removed with a rongeur.  A K-wire was then inserted just slightly dorsal and lateral from the center point of the metatarsal head.  The K-wire was advanced into the metatarsal shaft.  The reamer was then inserted and advanced down almost to the level of the articular cartilage.  The K- wire was removed and all the bone fragments were removed.  Wound was irrigated copiously.  Then 10 mm Cartiva implant was then inserted to the appropriate depth.  The joint was reduced.  Dorsiflexion of the hallux was then noted to be approximately 75 degrees.  Plantar flexion approximately 75 degrees.  Wound was again irrigated.  Joint capsule was repaired with simple sutures of 2-0 Vicryl.  Subcutaneous tissues were approximated with inverted simple sutures of 3-0 Monocryl and a running 3-0 nylon was used to close the skin incision.  Sterile dressings were applied  followed by a compression wrap.  Tourniquet was released at 22 minutes after application of the dressings.  0.25% Marcaine with epinephrine was infiltrated into the subcutaneous tissues for postoperative pain control.  The patient was awakened by Anesthesia and transported to the recovery room in stable condition.  FOLLOWUP PLAN:  The patient will be weightbearing as tolerated on the right foot in a flat postop shoe.  She will follow up with me in the office in 2 weeks for suture removal.     Wylene Simmer,  MD   ______________________________ Wylene Simmer, MD    JH/MEDQ  D:  03/22/2016  T:  03/23/2016  Job:  [id number]

## 2016-04-08 ENCOUNTER — Other Ambulatory Visit: Payer: 59

## 2016-04-28 ENCOUNTER — Ambulatory Visit: Payer: Self-pay | Admitting: Podiatry

## 2016-12-06 ENCOUNTER — Encounter: Payer: Self-pay | Admitting: Sports Medicine

## 2016-12-06 ENCOUNTER — Ambulatory Visit (INDEPENDENT_AMBULATORY_CARE_PROVIDER_SITE_OTHER): Payer: 59 | Admitting: Sports Medicine

## 2016-12-06 DIAGNOSIS — L600 Ingrowing nail: Secondary | ICD-10-CM | POA: Diagnosis not present

## 2016-12-06 DIAGNOSIS — M79675 Pain in left toe(s): Secondary | ICD-10-CM

## 2016-12-06 NOTE — Progress Notes (Signed)
Subjective: Deanna Barry is a 56 y.o. female patient presents to office today complaining of a painful incurvated, red, hot, swollen medial nail border of the 2nd toe on the left foot. This has been present for 1 month. Patient has treated this by self trimming. Patient denies fever/chills/nausea/vomitting/any other related constitutional symptoms at this time.  Patient Active Problem List   Diagnosis Date Noted  . Malignant neoplasm of upper-inner quadrant of left female breast ([place]) 02/03/2016  . Chest pain 07/27/2015  . Genetic testing 06/24/2015  . Family history of breast cancer   . Family history of colon cancer   . Family history of ovarian cancer   . Family history of kidney cancer   . Painful urination 07/30/2013  . Breast cancer, left breast ([place]) 01/05/2012  . 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 atenolol (TENORMIN) 25 MG tablet Take 1 tablet (25 mg total) by mouth daily. AM 30 tablet 1  . atorvastatin (LIPITOR) 40 MG tablet Take 40 mg by mouth daily.    Marland Kitchen levothyroxine (SYNTHROID, LEVOTHROID) 25 MCG tablet Take 1 tablet (25 mcg total) by mouth daily. AM 30 tablet 2  . omeprazole (PRILOSEC) 20 MG capsule Take 20 mg by mouth daily. AM    . tamoxifen (NOLVADEX) 20 MG tablet Take 1 tablet (20 mg total) by mouth daily. AM 90 tablet 3  . venlafaxine (EFFEXOR) 75 MG tablet Take 1 tablet (75 mg total) by mouth daily. AM 60 tablet 5   No current facility-administered medications on file prior to visit.     Allergies  Allergen Reactions  . Augmentin [Amoxicillin-Pot Clavulanate] Rash  . Ciprofloxacin Hcl Rash  . Other Other (See Comments)    SCENTED LOTIONS - MIGRAINES  . Penicillins Cross Reactors Itching and Rash    Objective:  There were no vitals filed for this visit.  General: Well developed, nourished, in no acute distress, alert and oriented x3    Dermatology: Skin is warm, dry and supple bilateral. Left 2nd toenail appears to be severely incurvated with hyperkeratosis formation at the distal aspects of  the medial nail border. (+) Erythema. (+) Edema. (-) serosanguous drainage present. The remaining nails appear unremarkable at this time. There are no open sores, lesions or other signs of infection  present.  Vascular: Dorsalis Pedis artery and Posterior Tibial artery pedal pulses are 2/4 bilateral with immedate capillary fill time. Pedal hair growth present. No lower extremity edema.   Neruologic: Grossly intact via light touch bilateral.  Musculoskeletal: Tenderness to palpation of the left 2nd toenail fold at medial aspect. Muscular strength within normal limits in all groups bilateral.   Assesement and Plan: Problem List Items Addressed This Visit    None    Visit Diagnoses    Ingrown nail    -  Primary   Toe pain, left          -Discussed treatment alternatives and plan of care; Explained permanent/temporary nail avulsion and post procedure course to patient. - After a verbal consent, injected 3 ml of a 50:50 mixture of 2% plain lidocaine and 0.5% plain marcaine in a normal digital block fashion. Next, a  betadine prep was performed. Anesthesia was tested and found to be appropriate.  The offending left 2nd toenail medial border was then incised from the hyponychium to the epinychium. The offending nail border was removed and cleared from the field.  The area was curretted for any remaining nail or spicules. Phenol application performed and the area was then flushed with alcohol and dressed with antibiotic cream and a dry sterile dressing. -Patient was instructed to leave the dressing intact for today and begin soaking  in a weak solution of betadine or epsom salt and water tomorrow. Patient was instructed to  soak for 15 minutes each day and apply neosporin and a gauze or bandaid dressing each day. -Patient was instructed  to monitor the toe for signs of infection and return to office if toe becomes red, hot or swollen. -Advised ice, elevation, and tylenol or motrin if needed for pain.  -Patient is to return in 2 weeks for follow up care/nail check or sooner if problems arise.  Landis Martins, DPM

## 2016-12-06 NOTE — Patient Instructions (Signed)

## 2016-12-20 ENCOUNTER — Encounter: Payer: Self-pay | Admitting: Sports Medicine

## 2016-12-20 ENCOUNTER — Ambulatory Visit (INDEPENDENT_AMBULATORY_CARE_PROVIDER_SITE_OTHER): Payer: Self-pay | Admitting: Sports Medicine

## 2016-12-20 DIAGNOSIS — Z9889 Other specified postprocedural states: Secondary | ICD-10-CM

## 2016-12-20 DIAGNOSIS — M79675 Pain in left toe(s): Secondary | ICD-10-CM

## 2016-12-20 NOTE — Progress Notes (Signed)
Subjective: Deanna Barry is a 56 y.o. female patient returns to office today for follow up evaluation after having Left 2nd toe medial permanent nail avulsion performed on 12-06-16. Patient has been soaking using epsom salt and applying topical antibiotic covered with bandaid daily. Patient admits to a little tenderness and has been doing a lot on her feet with trying to move; deniesfever/chills/nausea/vomitting/any other related constitutional symptoms at this time.  Patient Active Problem List   Diagnosis Date Noted  . Malignant neoplasm of upper-inner quadrant of left female breast ([place]) 02/03/2016  . Chest pain 07/27/2015  . Genetic testing 06/24/2015  . Family history of breast cancer   . Family history of colon cancer   . Family history of ovarian cancer   . Family history of kidney cancer   . Painful urination 07/30/2013  . Breast cancer, left breast ([place]) 01/05/2012  . 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 atenolol (TENORMIN) 25 MG tablet Take 1 tablet (25 mg total) by mouth daily. AM 30 tablet 1  . atorvastatin (LIPITOR) 40 MG tablet Take 40 mg by mouth daily.    Marland Kitchen levothyroxine (SYNTHROID, LEVOTHROID) 25 MCG tablet Take 1 tablet (25 mcg total) by mouth daily. AM 30 tablet 2  . omeprazole (PRILOSEC) 20 MG capsule Take 20 mg by mouth daily. AM    . tamoxifen (NOLVADEX) 20 MG tablet Take 1 tablet (20 mg total) by mouth daily. AM 90 tablet 3  . venlafaxine (EFFEXOR) 75 MG tablet Take 1 tablet (75 mg total) by mouth daily. AM 60 tablet 5   No current facility-administered medications on file prior to visit.     Allergies  Allergen Reactions  . Augmentin [Amoxicillin-Pot Clavulanate] Rash  . Ciprofloxacin Hcl Rash  . Other Other (See Comments)    SCENTED LOTIONS - MIGRAINES  . Penicillins Cross Reactors Itching and Rash    Objective:  General: Well developed, nourished,  in no acute distress, alert and oriented x3   Dermatology: Skin is warm, dry and supple bilateral. Left 2nd toenail medial nail bed appears to be clean, dry, with mild granular tissue and surrounding eschar/scab. Decreased Erythema. Decreased Edema. (-) serosanguous drainage present. The remaining nails appear unremarkable at this time. There are no other lesions or other signs of infection  present.  Neurovascular status: Intact. No lower extremity swelling; No pain with calf compression bilateral.  Musculoskeletal: Decreased tenderness to palpation of the Left 2nd toenail medial nail fold. Muscular strength within normal limits bilateral.   Assesement and Plan: Problem List Items Addressed This Visit    None    Visit Diagnoses    S/P nail surgery    -  Primary   Toe pain, left          -Examined patient  -Cleansed left 2nd toe medial nail fold and gently scrubbed with peroxide and q-tip/curetted away eschar at site and applied antibiotic cream covered with bandaid.  -Discussed plan of care with patient. -Patient to continue soaking in a weak solution of Epsom salt and warm water. Patient was instructed to soak for 15-20 minutes each day until the toe appears normal and there is no drainage, redness, tenderness, or swelling at the procedure site, and apply neosporin and a gauze or bandaid dressing each day as needed. May leave open to air at night. -Educated patient on long term care after nail surgery. -Patient was  instructed to monitor the toe for reoccurrence and signs of infection; Patient advised to return to office or go to ER if toe becomes red, hot or swollen. -Patient is to return as needed or sooner if problems arise.  Landis Martins, DPM

## 2016-12-28 ENCOUNTER — Telehealth: Payer: Self-pay | Admitting: Oncology

## 2016-12-28 NOTE — Telephone Encounter (Signed)
Pt came into office to r/s May appts to April due to pt moving to [place] and needs rx refills until she finds another oncologist. Pt has new appt date/time

## 2017-01-06 ENCOUNTER — Other Ambulatory Visit: Payer: Self-pay | Admitting: *Deleted

## 2017-01-06 DIAGNOSIS — C50912 Malignant neoplasm of unspecified site of left female breast: Secondary | ICD-10-CM

## 2017-01-09 ENCOUNTER — Ambulatory Visit (HOSPITAL_BASED_OUTPATIENT_CLINIC_OR_DEPARTMENT_OTHER): Payer: 59 | Admitting: Adult Health

## 2017-01-09 ENCOUNTER — Other Ambulatory Visit (HOSPITAL_BASED_OUTPATIENT_CLINIC_OR_DEPARTMENT_OTHER): Payer: 59

## 2017-01-09 ENCOUNTER — Telehealth: Payer: Self-pay | Admitting: Oncology

## 2017-01-09 ENCOUNTER — Encounter: Payer: Self-pay | Admitting: Adult Health

## 2017-01-09 VITALS — BP 138/93 | HR 102 | Temp 97.7°F | Resp 18 | Ht 64.0 in | Wt 215.3 lb

## 2017-01-09 DIAGNOSIS — C50912 Malignant neoplasm of unspecified site of left female breast: Secondary | ICD-10-CM

## 2017-01-09 DIAGNOSIS — Z170 Estrogen receptor positive status [ER+]: Secondary | ICD-10-CM | POA: Diagnosis not present

## 2017-01-09 DIAGNOSIS — C50212 Malignant neoplasm of upper-inner quadrant of left female breast: Secondary | ICD-10-CM

## 2017-01-09 DIAGNOSIS — R7309 Other abnormal glucose: Secondary | ICD-10-CM | POA: Diagnosis not present

## 2017-01-09 DIAGNOSIS — M722 Plantar fascial fibromatosis: Secondary | ICD-10-CM

## 2017-01-09 LAB — CBC WITH DIFFERENTIAL/PLATELET
BASO%: 0.3 % (ref 0.0–2.0)
BASOS ABS: 0 10*3/uL (ref 0.0–0.1)
EOS ABS: 0.5 10*3/uL (ref 0.0–0.5)
EOS%: 6.2 % (ref 0.0–7.0)
HCT: 45.3 % (ref 34.8–46.6)
HEMOGLOBIN: 15 g/dL (ref 11.6–15.9)
LYMPH#: 2.2 10*3/uL (ref 0.9–3.3)
LYMPH%: 30.2 % (ref 14.0–49.7)
MCH: 29.3 pg (ref 25.1–34.0)
MCHC: 33.1 g/dL (ref 31.5–36.0)
MCV: 88.5 fL (ref 79.5–101.0)
MONO#: 0.5 10*3/uL (ref 0.1–0.9)
MONO%: 7 % (ref 0.0–14.0)
NEUT#: 4.1 10*3/uL (ref 1.5–6.5)
NEUT%: 56.3 % (ref 38.4–76.8)
Platelets: 185 10*3/uL (ref 145–400)
RBC: 5.12 10*6/uL (ref 3.70–5.45)
RDW: 12.8 % (ref 11.2–14.5)
WBC: 7.3 10*3/uL (ref 3.9–10.3)

## 2017-01-09 LAB — COMPREHENSIVE METABOLIC PANEL
ALBUMIN: 4 g/dL (ref 3.5–5.0)
ALT: 41 U/L (ref 0–55)
AST: 31 U/L (ref 5–34)
Alkaline Phosphatase: 69 U/L (ref 40–150)
Anion Gap: 13 mEq/L — ABNORMAL HIGH (ref 3–11)
BUN: 17.9 mg/dL (ref 7.0–26.0)
CHLORIDE: 103 meq/L (ref 98–109)
CO2: 26 mEq/L (ref 22–29)
CREATININE: 0.8 mg/dL (ref 0.6–1.1)
Calcium: 10.1 mg/dL (ref 8.4–10.4)
EGFR: 78 mL/min/{1.73_m2} — ABNORMAL LOW (ref >90)
GLUCOSE: 146 mg/dL — AB (ref 70–140)
POTASSIUM: 4.2 meq/L (ref 3.5–5.1)
SODIUM: 142 meq/L (ref 136–145)
Total Bilirubin: 0.65 mg/dL (ref 0.20–1.20)
Total Protein: 7.4 g/dL (ref 6.4–8.3)

## 2017-01-09 MED ORDER — MELOXICAM 15 MG PO TABS: 15.0000 mg | ORAL_TABLET | Freq: Every day | ORAL | 0 refills | Status: DC

## 2017-01-09 MED ORDER — TAMOXIFEN CITRATE 20 MG PO TABS: 20.0000 mg | ORAL_TABLET | Freq: Every day | ORAL | 3 refills | Status: DC

## 2017-01-09 NOTE — Telephone Encounter (Signed)
Gave patient avs report and appointments for April 2019.  °

## 2017-01-09 NOTE — Progress Notes (Signed)
ID: Deanna Barry   DOB: 1961/04/06  MR#: [id number]  WGY#:[id number]  PCP: Jonathon Bellows, MD GYN: Servando Salina SU:  OTHER MD: Bjorn Loser. Arlys John  CHIEF COMPLAINT: Estrogen receptor positive breast cancer  CURRENT TREATMENT: Tamoxifen  HISTORY OF PRESENT ILLNESS: As per previously documented note:  Deanna Barry had screening mammography August 2011, which noted dense breasts.  In addition, calcifications were noted on the left side and she was brought back for additional imaging May 07, 2010.  Dr March Rummage felt these were benign and he saw no change in breast architecture as compared to July 2009, which was the prior mammogram.  Accordingly, a 54-monthrepeat mammography for followup of the calcifications was scheduled.  This was performed November 23, 2010.  Dr CMarcelo Baldyfound that the previously noted calcifications were stable but that there was a partially circumscribed mass now noted in the lower inner quadrant of the left breast.  By ultrasound this was lobulated and hypoechoic and measured approximately 8 mm.  Accordingly, the patient was brought back for ultrasound-guided biopsy November 25, 2010, and this showed [phone] an invasive ductal carcinoma that appeared to be low grade and was associated with abundant extracellular mucin.  The tumor showed no HER-2/neu amplification, with a CIS ratio of 1.26.  It was strongly estrogen and progesterone receptor positive at 96% and 94% respectively.  The proliferation marker was 20%.    With this information, the patient was referred to our multidisciplinary clinic and bilateral breast MRIs were obtained November 30, 2010.  This showed the left breast mass to measure 1.7 cm and also noted a skin lesion or sebaceous epithelial inclusion cyst at the junction of the middle and posterior third of the breast, 4 o'clock position, associated with the skin.  There was an incidentally noted small right pleural effusion and a pulmonary  nodule in the left upper lobe, which was seen on CT scan from July 2006 (measuring 8 mm at that time).    Patient underwent left mastectomy and sentinel lymph node sampling with simple right mastectomy in March 2012 for a T1CN0, grade 1, invasive ductal carcinoma. Tumor was strongly ER and PR positive, HER-2/neu equivocal, with an Oncotype DX which quoted her a risk of recurrence at 10 years of 9% if she takes 5 years of tamoxifen. Patient was started on tamoxifen in May of 2012, with good tolerance. Patient is known to be BRCA1 and BRCA2 negative.  Her subsequent history is as detailed below  INTERVAL HISTORY: Deanna Polingreturns today for followup visit for her estrogen receptor positive breast cancer. She is doing well today.  She continues to take Tamoxifen daily.  She isn't having any difficulty with this.  She hasn't had a period since starting the Tamoxifen.  She denies any vaginal bleeding or pelvic pain.  She denies any new persistent pain, or concerning symptoms.  [place] or may not be getting a new oncologist.  She is currently undecided if she will be traveling back here [place].    REVIEW OF SYSTEMS: Deanna Polingdenies fevers, chills, nausea, vomiting, constipation, diarrhea, neuropathy, bowel changes, new pain, headaches, vision changes, cough, shortness of breath or any further concerns.  A detailed ROS was non contributory.    PAST M EDICAL HISTORY: Past Medical History:  Diagnosis Date  . Abrasion of right knee 10/2011   fell on ice  . Anxiety   . Arthritis   . Breast  cancer, left breast ([place]) 01/05/2012  . Chest pain 07/27/2015  . Complication of anesthesia 12/28/2010   woke up combative  . Family history of breast cancer   . Family history of colon cancer   . Family history of kidney cancer   . Family history of ovarian cancer   . GERD (gastroesophageal reflux disease)   . Headache(784.0)    hx. migraines  . Hypertension    under control; has  been on med. x 1-2 yrs.  . Hypothyroidism   . Sleep apnea    no CPAP; occ. wakes up gasping/choking  . TMJ syndrome     PAST SURGICAL HISTORY: Past Surgical History:  Procedure Laterality Date  . BREAST RECONSTRUCTION  11/17/2011   Procedure: BREAST RECONSTRUCTION;  Surgeon: Theodoro Kos, DO;  Location: [facility];  Service: Plastics;  Laterality: Bilateral;  BILATERAL BREAST RECONSTRUCTION WITH EXCISON AND REPAIR EXCESS SKIN ON LATERAL ASPECT AND AXILLARY AREAST WITH SUCTION ASSISTED LIPECTOMY  . CHEILECTOMY Right 03/22/2016   Procedure: RIGHT HALLUX METATARSALPHALANGEAL JOINT (MTPJ) RESURFACING CHEILECTOMY  ;  Surgeon: Wylene Simmer, MD;  Location: [facility];  Service: Orthopedics;  Laterality: Right;  . DILATION AND CURETTAGE OF UTERUS  07/16/2004   hysteroscopy  . ELBOW ARTHROSCOPY  12/10/2003   left  . FOOT SURGERY     BOTH  . FOREARM SURGERY     RIGHT  . MASTECTOMY  12/28/2010   bilat.  . THYROID LOBECTOMY  04/14/2004   left; isthmusectomy  . UVULOPALATOPHARYNGOPLASTY      FAMILY HISTORY Family History  Problem Relation Age of Onset  . Breast cancer Mother 96  . Kidney cancer Mother 91  . Colon cancer Maternal Grandmother     dx in her 43s  . Breast cancer Paternal Grandmother     dx under 20s  . Ovarian cancer Paternal Grandmother   . Lung cancer Paternal Grandfather     smoker  . Hodgkin's lymphoma Paternal Aunt   . Prostate cancer Maternal Grandfather   . Cancer Cousin     Maternal first cousin with cancer NOS   GYNECOLOGIC HISTORY:  The patient is GX, P0.  She had her first period at age 17.  Most recent period was November 25, 2010.    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  She lives with her partner, [name].  Deanna Barry is thinking of retiring perhaps in late 2017. If that happens, she will probably move to [place] at that time.    ADVANCED DIRECTIVES: Not in place  HEALTH MAINTENANCE: Social History  Substance  Use Topics  . Smoking status: Former Smoker    Packs/day: 1.00    Years: 12.00    Types: Cigarettes  . Smokeless tobacco: Never Used  . Alcohol use Yes     Comment: socially     Colonoscopy: 2013/ [facility]  PAP:  Bone density:  Lipid panel:  Allergies  Allergen Reactions  . Augmentin [Amoxicillin-Pot Clavulanate] Rash  . Ciprofloxacin Hcl Rash  . Other Other (See Comments)    SCENTED LOTIONS - MIGRAINES  . Penicillins Cross Reactors Itching and Rash    Current Outpatient Prescriptions  Medication Sig Dispense Refill  . atenolol (TENORMIN) 25 MG tablet Take 1 tablet (25 mg total) by mouth daily. AM 30 tablet 1  . atorvastatin (LIPITOR) 40 MG tablet Take 40 mg by mouth daily.    Marland Kitchen levothyroxine (SYNTHROID, LEVOTHROID) 25 MCG tablet Take 1 tablet (25 mcg total) by mouth  daily. AM 30 tablet 2  . omeprazole (PRILOSEC) 20 MG capsule Take 20 mg by mouth daily. AM    . tamoxifen (NOLVADEX) 20 MG tablet Take 1 tablet (20 mg total) by mouth daily. AM 90 tablet 3  . venlafaxine (EFFEXOR) 75 MG tablet Take 1 tablet (75 mg total) by mouth daily. AM 60 tablet 5   No current facility-administered medications for this visit.     OBJECTIVE: Middle-aged white woman In no acute distress  Vitals:   01/09/17 1153  BP: (!) 138/93  Pulse: (!) 102  Resp: 18  Temp: 97.7 F (36.5 C)     Body mass index is 36.96 kg/m.    GENERAL: Patient is a well appearing female in no acute distress HEENT:  Sclerae anicteric.  PERRL.  Oropharynx clear and moist. No ulcerations or evidence of oropharyngeal candidiasis. Neck is supple.  NODES:  No cervical, supraclavicular, or axillary lymphadenopathy palpated.  BREAST EXAM:  s/p bilateral mastectomies, no nodularity, masses, or skin changes noted.  Benign bilateral breast exams.   LUNGS:  Clear to auscultation bilaterally.  No wheezes or rhonchi. HEART:  Regular rate and rhythm. No murmur appreciated. ABDOMEN:  Soft, nontender.  Positive, normoactive bowel  sounds. No organomegaly palpated. MSK:  No focal spinal tenderness to palpation. Full range of motion bilaterally in the upper extremities. EXTREMITIES:  No peripheral edema.   SKIN:  Clear with no obvious rashes or skin changes. No nail dyscrasia. NEURO:  Nonfocal. Well oriented.  Appropriate affect.  LABS    Component Value Date/Time   LABSPEC 1.030 07/30/2013 1351   PHURINE 6.0 07/30/2013 1351   GLUCOSEU Negative 07/30/2013 1351   HGBUR Moderate 07/30/2013 1351   BILIRUBINUR Negative 07/30/2013 1351   KETONESUR Negative 07/30/2013 1351   PROTEINUR < 30 07/30/2013 1351   UROBILINOGEN 0.2 07/30/2013 1351   NITRITE Negative 07/30/2013 1351   LEUKOCYTESUR Negative 07/30/2013 1351     Lab Results  Component Value Date   WBC 7.3 01/09/2017   NEUTROABS 4.1 01/09/2017   HGB 15.0 01/09/2017   HCT 45.3 01/09/2017   MCV 88.5 01/09/2017   PLT 185 01/09/2017      Chemistry      Component Value Date/Time   NA 146 (H) 02/04/2016 1605   K 4.2 02/04/2016 1605   CL 106 12/27/2012 0728   CO2 31 (H) 02/04/2016 1605   BUN 22.2 02/04/2016 1605   CREATININE 0.9 02/04/2016 1605      Component Value Date/Time   CALCIUM 9.8 02/04/2016 1605   ALKPHOS 62 02/04/2016 1605   AST 56 (H) 02/04/2016 1605   ALT 61 (H) 02/04/2016 1605   BILITOT 0.71 02/04/2016 1605       Lab Results  Component Value Date   LABCA2 21 12/27/2012       ASSESSMENT: 56 y.o.  BRCA negative [place] woman   (1)  status post left mastectomy and sentinel lymph node sampling with simple right mastectomy, March 2012, for a T1c N0 grade1 invasive ductal carcinoma, strongly estrogen and progesterone receptor positive, HER-2 was 1.26 and 2.04 ("equivocal" per 2012 guidelines) on separate determinations  (2) Oncotype DX which quotes her a risk of recurrence at 10 years of 9% if her only systemic therapy is 5 years of tamoxifen. (HER-2 was also negative per Oncotype)    (3)  started on tamoxifen May 2012, to be  continued for total of 10 years  (4) broader GENETICS TESTING 06/24/2015 canceled because of cost concerns  PLAN:  Deanna Barry is doing well today.  She is clinically without any sign of recurrence.  She will continue taking tamoxifen daily and I have refilled this for her today.  Her CBC and CMET are normal, with exception of mildly elevated random glucose of 146.  I counseled her on limiting simple carbs and sugars.  I will refill her Meloxicam x 1 as she is moving.  She will set up with new pcp and ? Oncologist once she gets to [place].    All questions were answered.  Patient verbalized understanding of the plan and agrees with it.  She knows to call prior to her next appointment for any questions or concerns.    A total of (30) minutes of face-to-face time was spent with this patient with greater than 50% of that time in counseling and care-coordination.   Scot Dock, NP  Medical oncology    01/09/2017

## 2017-02-02 ENCOUNTER — Other Ambulatory Visit: Payer: 59

## 2017-02-09 ENCOUNTER — Ambulatory Visit: Payer: 59 | Admitting: Oncology

## 2017-02-24 ENCOUNTER — Other Ambulatory Visit: Payer: Self-pay

## 2017-07-04 ENCOUNTER — Telehealth: Payer: Self-pay | Admitting: Oncology

## 2017-07-04 NOTE — Telephone Encounter (Signed)
Faxed records to [facility]  [phone]

## 2017-07-28 ENCOUNTER — Other Ambulatory Visit: Payer: Self-pay | Admitting: *Deleted

## 2018-01-15 ENCOUNTER — Telehealth: Payer: Self-pay | Admitting: Oncology

## 2018-01-15 NOTE — Telephone Encounter (Signed)
Patient called to cancel she moved to [place]

## 2018-01-25 ENCOUNTER — Ambulatory Visit: Payer: 59 | Admitting: Oncology

## 2018-01-25 ENCOUNTER — Other Ambulatory Visit: Payer: 59

## 2018-11-05 ENCOUNTER — Telehealth: Payer: Self-pay | Admitting: *Deleted

## 2018-11-05 NOTE — Telephone Encounter (Signed)
Medical records faxed to [facility] - Dr. Shane Crutch; RI# [id number]

## 2021-12-01 DEATH — deceased
